# Patient Record
Sex: Female | Born: 1985 | Race: Black or African American | Hispanic: No | Marital: Single | Smoking: Never smoker
Health system: Southern US, Community
[De-identification: ages and names within clinical notes are randomized; demographics above are authoritative.]

## PROBLEM LIST (undated history)

## (undated) DIAGNOSIS — L309 Dermatitis, unspecified: Secondary | ICD-10-CM

## (undated) DIAGNOSIS — J45909 Unspecified asthma, uncomplicated: Secondary | ICD-10-CM

## (undated) HISTORY — DX: Unspecified asthma, uncomplicated: J45.909

## (undated) HISTORY — DX: Dermatitis, unspecified: L30.9

---

## 2017-04-14 ENCOUNTER — Encounter (INDEPENDENT_AMBULATORY_CARE_PROVIDER_SITE_OTHER): Payer: Self-pay | Admitting: Physician Assistant

## 2017-04-14 ENCOUNTER — Ambulatory Visit (INDEPENDENT_AMBULATORY_CARE_PROVIDER_SITE_OTHER): Payer: Medicaid Other | Admitting: Physician Assistant

## 2017-04-14 VITALS — BP 110/71 | HR 75 | Temp 98.4°F | Ht 67.0 in | Wt 254.4 lb

## 2017-04-14 DIAGNOSIS — Z0000 Encounter for general adult medical examination without abnormal findings: Secondary | ICD-10-CM | POA: Diagnosis not present

## 2017-04-14 DIAGNOSIS — Z029 Encounter for administrative examinations, unspecified: Secondary | ICD-10-CM

## 2017-04-14 NOTE — Progress Notes (Signed)
   Subjective:  Patient ID: Mckenzie [name], female    DOB: [birth year]  Age: 31 y.o. MRN: [id number]  CC: physical exam and PPD  HPI Mckenzie JuryYvette Argo is a [age] y.o. 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 Does not bring physical exam form but shows a link of document on her phone. Does not have any immunization records for review either. Does not have any complaints or symptoms.   ROS Review of Systems  Constitutional: Negative for chills, fever and malaise/fatigue.  Eyes: Negative for blurred vision.  Respiratory: Negative for shortness of breath.   Cardiovascular: Negative for chest pain and palpitations.  Gastrointestinal: Negative for abdominal pain and nausea.  Genitourinary: Negative for dysuria and hematuria.  Musculoskeletal: Negative for joint pain and myalgias.  Skin: Negative for rash.  Neurological: Negative for tingling and headaches.  Psychiatric/Behavioral: Negative for depression. The patient is not nervous/anxious.     Objective:  BP 110/71 (BP Location: Left Arm, Patient Position: Sitting, Cuff Size: Large)   Pulse 75   Temp 98.4 F (36.9 C) (Oral)   Ht 5\' 7"  (1.702 m)   Wt 254 lb 6.4 oz (115.4 kg)   SpO2 99%   BMI 39.84 kg/m   BP/Weight 04/14/2017  Systolic BP 110  Diastolic BP 71  Wt. (Lbs) 254.4  BMI 39.84      Physical Exam  Constitutional: She is oriented to person, place, and time.  Well developed, obese, NAD, polite  HENT:  Head: Normocephalic and atraumatic.  Mouth/Throat: No oropharyngeal exudate.  No oral thrush  Eyes: No scleral icterus.  Neck: Normal range of motion. Neck supple. No thyromegaly present.  Cardiovascular: Normal rate, regular rhythm and normal heart sounds.   Pulmonary/Chest: Effort normal and breath sounds normal. No respiratory distress. She has no wheezes. She has no rales.  Abdominal: Soft. Bowel sounds are normal. She exhibits no distension. There is no tenderness.   Musculoskeletal: She exhibits no edema.  LEs, UEs, and back with full aROM  Lymphadenopathy:    She has no cervical adenopathy.  Neurological: She is alert and oriented to person, place, and time. No cranial nerve deficit. Coordination normal.  Gait normal  Skin: Skin is warm and dry. No rash noted. No erythema. No pallor.  Psychiatric: She has a normal mood and affect. Her behavior is normal. Thought content normal.  Vitals reviewed.    Assessment & Plan:   1. Annual physical exam - TSH; Future - CBC with Differential; Future - Comprehensive metabolic panel; Future - Hepatitis B surface antibody; Future - Lipid panel  2. Encounters for administrative purposes - PPD - TSH; Future - CBC with Differential; Future - Comprehensive metabolic panel; Future - Hepatitis B surface antibody; Future - Lipid panel    Follow-up: Return 48-72 hrs for PPD read.   Loletta Specteroger David Gomez PA

## 2017-04-14 NOTE — Patient Instructions (Signed)
Tuberculin Skin Test  Why am I having this test?  Tuberculosis (TB) is a bacterial infection caused by Mycobacterium tuberculosis. Most people who are exposed to these bacteria have a strong enough defense (immune) system to prevent the bacteria from causing TB and developing symptoms. Their bodies prevent the germs from being active and making them sick (latent TB infection).  However, if you have TB germs in your body and your immune system is weak, you can develop a TB infection. This can cause symptoms such as:  · Night sweats.  · Fever.  · Weakness.  · Weight loss.    A latent TB infection can also become active later in life if your immune system becomes weakened or compromised.  You may have this test if your health care provider suspects that you have TB. You may also have this test to screen for TB if you are at risk for getting the disease. Those at increased risk include:  · People who inject illegal drugs or share needles.  · People with HIV or other diseases that affect immunity.  · Health care workers.  · People who live in high-risk communities, such as homeless shelters, nursing homes, and correctional facilities.  · People who have been in contact with someone with TB.  · People from countries where TB is more common.    If you are in a high-risk group, your health care provider may wish to screen for TB more often. This can help prevent the spread of the disease. Sometimes TB screening is required when starting a new job, such as becoming a health care worker or a teacher. Colleges or universities may require it of new students.  What is being tested?  A tuberculin skin test is the main test used to check for exposure to the bacteria that can cause TB. The test checks for antibodies to the bacteria. Antibodies are proteins that your body produces to protect you from germs and other things that can make you sick.  Your health care provider will inject a solution known as PPD (purified  protein derivative) under the first layer of skin on your arm. This causes a blister-like bubble to form at the site. Your health care provider will then examine the site after a number of hours have passed to see if a reaction has occurred.  How do I prepare for this test?  There is no preparation required for this test.  What do the results mean?  Your test results will be reported as either negative or positive.  If the tuberculin skin test produces a negative result, it is likely that you do not have TB and have not been exposed to the TB bacteria.  If you or your health care provider suspects exposure, however, you may want to repeat the test a few weeks later. A blood test may also be used to check for TB. This is because you will not react to the tuberculin skin test until several weeks after exposure to TB bacteria.  If you test positive to the tuberculin skin test, it is likely that you have been exposed to TB bacteria. The test does not distinguish between an active and a latent TB infection.  A false-positive result can occur. A false-positive result for TB bacteria is incorrect because it indicates a condition or finding is present when it is not.  Talk to your health care provider to discuss your results, treatment options, and if necessary, the need for more tests.    It is your responsibility to obtain your test results. Ask the lab or department performing the test when and how you will get your results. Talk with your health care provider if you have any questions about your results.  Talk with your health care provider to discuss your results, treatment options, and if necessary, the need for more tests. Talk with your health care provider if you have any questions about your results.  This information is not intended to replace advice given to you by your health care provider. Make sure you discuss any questions you have with your health care provider.   Document Released: 07/03/2005 Document Revised: 05/26/2016 Document Reviewed: 01/17/2014  Elsevier Interactive Patient Education © 2018 [employer]

## 2017-04-16 ENCOUNTER — Other Ambulatory Visit (INDEPENDENT_AMBULATORY_CARE_PROVIDER_SITE_OTHER): Payer: Medicaid Other

## 2017-04-16 DIAGNOSIS — Z0000 Encounter for general adult medical examination without abnormal findings: Secondary | ICD-10-CM

## 2017-04-16 DIAGNOSIS — Z029 Encounter for administrative examinations, unspecified: Secondary | ICD-10-CM

## 2017-04-16 LAB — TB SKIN TEST
INDURATION: 0 mm
TB SKIN TEST: NEGATIVE

## 2017-04-17 ENCOUNTER — Telehealth (INDEPENDENT_AMBULATORY_CARE_PROVIDER_SITE_OTHER): Payer: Self-pay

## 2017-04-17 LAB — COMPREHENSIVE METABOLIC PANEL
ALK PHOS: 49 IU/L (ref 39–117)
ALT: 12 IU/L (ref 0–32)
AST: 12 IU/L (ref 0–40)
Albumin/Globulin Ratio: 1.4 (ref 1.2–2.2)
Albumin: 4.2 g/dL (ref 3.5–5.5)
BILIRUBIN TOTAL: 0.3 mg/dL (ref 0.0–1.2)
BUN/Creatinine Ratio: 11 (ref 9–23)
BUN: 9 mg/dL (ref 6–20)
CHLORIDE: 103 mmol/L (ref 96–106)
CO2: 26 mmol/L (ref 20–29)
CREATININE: 0.83 mg/dL (ref 0.57–1.00)
Calcium: 9.4 mg/dL (ref 8.7–10.2)
GFR calc Af Amer: 108 mL/min/{1.73_m2} (ref >59)
GFR calc non Af Amer: 94 mL/min/{1.73_m2} (ref >59)
GLUCOSE: 88 mg/dL (ref 65–99)
Globulin, Total: 2.9 g/dL (ref 1.5–4.5)
Potassium: 4.2 mmol/L (ref 3.5–5.2)
SODIUM: 141 mmol/L (ref 134–144)
Total Protein: 7.1 g/dL (ref 6.0–8.5)

## 2017-04-17 LAB — LIPID PANEL
CHOLESTEROL TOTAL: 167 mg/dL (ref 100–199)
Chol/HDL Ratio: 3.6 ratio (ref 0.0–4.4)
HDL: 46 mg/dL (ref >39)
LDL CALC: 107 mg/dL — AB (ref 0–99)
Triglycerides: 71 mg/dL (ref 0–149)
VLDL CHOLESTEROL CAL: 14 mg/dL (ref 5–40)

## 2017-04-17 LAB — CBC WITH DIFFERENTIAL/PLATELET
BASOS ABS: 0 10*3/uL (ref 0.0–0.2)
Basos: 0 %
EOS (ABSOLUTE): 0.2 10*3/uL (ref 0.0–0.4)
Eos: 3 %
Hematocrit: 39.7 % (ref 34.0–46.6)
Hemoglobin: 13.4 g/dL (ref 11.1–15.9)
Immature Grans (Abs): 0 10*3/uL (ref 0.0–0.1)
Immature Granulocytes: 0 %
LYMPHS ABS: 2.7 10*3/uL (ref 0.7–3.1)
Lymphs: 39 %
MCH: 31.8 pg (ref 26.6–33.0)
MCHC: 33.8 g/dL (ref 31.5–35.7)
MCV: 94 fL (ref 79–97)
Monocytes Absolute: 0.3 10*3/uL (ref 0.1–0.9)
Monocytes: 4 %
Neutrophils Absolute: 3.9 10*3/uL (ref 1.4–7.0)
Neutrophils: 54 %
Platelets: 401 10*3/uL — ABNORMAL HIGH (ref 150–379)
RBC: 4.21 x10E6/uL (ref 3.77–5.28)
RDW: 13.8 % (ref 12.3–15.4)
WBC: 7 10*3/uL (ref 3.4–10.8)

## 2017-04-17 LAB — HEPATITIS B SURFACE ANTIBODY, QUANTITATIVE: HEPATITIS B SURF AB QUANT: 29.8 m[IU]/mL

## 2017-04-17 LAB — TSH: TSH: 2.77 u[IU]/mL (ref 0.450–4.500)

## 2017-04-17 NOTE — Telephone Encounter (Signed)
-----   Message from Loletta Specteroger David Gomez, PA-C sent at 04/17/2017 12:08 PM EDT ----- She is showing immunity to Hep B. All other labs are normal also.

## 2017-04-17 NOTE — Telephone Encounter (Signed)
Results provided to patient. Mckenzie [name], CMA  

## 2017-08-16 ENCOUNTER — Encounter (HOSPITAL_COMMUNITY): Payer: Self-pay | Admitting: Emergency Medicine

## 2017-08-16 ENCOUNTER — Emergency Department (HOSPITAL_COMMUNITY): Payer: No Typology Code available for payment source

## 2017-08-16 ENCOUNTER — Emergency Department (HOSPITAL_COMMUNITY)
Admission: EM | Admit: 2017-08-16 | Discharge: 2017-08-16 | Disposition: A | Discharge status: Home / Self Care | Payer: No Typology Code available for payment source | Attending: Emergency Medicine | Admitting: Emergency Medicine

## 2017-08-16 DIAGNOSIS — S20212A Contusion of left front wall of thorax, initial encounter: Secondary | ICD-10-CM | POA: Diagnosis not present

## 2017-08-16 DIAGNOSIS — Y929 Unspecified place or not applicable: Secondary | ICD-10-CM | POA: Insufficient documentation

## 2017-08-16 DIAGNOSIS — Z23 Encounter for immunization: Secondary | ICD-10-CM | POA: Insufficient documentation

## 2017-08-16 DIAGNOSIS — S91012A Laceration without foreign body, left ankle, initial encounter: Secondary | ICD-10-CM

## 2017-08-16 DIAGNOSIS — Y999 Unspecified external cause status: Secondary | ICD-10-CM | POA: Diagnosis not present

## 2017-08-16 DIAGNOSIS — S99912A Unspecified injury of left ankle, initial encounter: Secondary | ICD-10-CM | POA: Diagnosis present

## 2017-08-16 DIAGNOSIS — Y939 Activity, unspecified: Secondary | ICD-10-CM | POA: Diagnosis not present

## 2017-08-16 MED ORDER — IBUPROFEN 800 MG PO TABS: 800.0000 mg | ORAL_TABLET | Freq: Three times a day (TID) | ORAL | 0 refills | Status: DC

## 2017-08-16 MED ORDER — LIDOCAINE HCL (PF) 1 % IJ SOLN
5.0000 mL | Freq: Once | INTRAMUSCULAR | Status: AC
  Administered 2017-08-16: 5 mL via INTRADERMAL
  Filled 2017-08-16: qty 5

## 2017-08-16 MED ORDER — TETANUS-DIPHTH-ACELL PERTUSSIS 5-2.5-18.5 LF-MCG/0.5 IM SUSP
0.5000 mL | Freq: Once | INTRAMUSCULAR | Status: AC
  Administered 2017-08-16: 0.5 mL via INTRAMUSCULAR
  Filled 2017-08-16: qty 0.5

## 2017-08-16 MED ORDER — CYCLOBENZAPRINE HCL 10 MG PO TABS: 10.0000 mg | ORAL_TABLET | Freq: Two times a day (BID) | ORAL | 0 refills | Status: DC | PRN

## 2017-08-16 MED ORDER — IBUPROFEN 800 MG PO TABS
800.0000 mg | ORAL_TABLET | Freq: Once | ORAL | Status: AC
  Administered 2017-08-16: 800 mg via ORAL
  Filled 2017-08-16: qty 1
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
## 2017-08-16 NOTE — ED Notes (Signed)
C Collar removed by Dr. Hyacinth MeekerMiller
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
## 2017-08-16 NOTE — ED Notes (Signed)
ED Provider at bedside. 

## 2017-08-16 NOTE — Discharge Instructions (Signed)
Please take Tylenol or ibuprofen for pain, Flexeril if you have muscle spasm and tightness, please apply warm compresses to the neck and the back to help prevent this.  You should have the stitches removed at your doctor's office within 7 days.  If you develop increased pain redness swelling or pus from the wound or any fevers return to the emergency department immediately.

## 2017-10-17 ENCOUNTER — Telehealth (INDEPENDENT_AMBULATORY_CARE_PROVIDER_SITE_OTHER): Payer: Self-pay | Admitting: Physician Assistant

## 2017-10-17 NOTE — Telephone Encounter (Signed)
Called patient to inform her that Tb results are ready for pick up at the front desk, patient voicemail is full unable to leave message. Maryjean Mornempestt S Rakwon Letourneau, CMA

## 2017-10-17 NOTE — Telephone Encounter (Signed)
Pt wants a copy of her TB Test . Please, call her to pick up at  [phone] Thank You

## 2017-12-09 ENCOUNTER — Encounter (INDEPENDENT_AMBULATORY_CARE_PROVIDER_SITE_OTHER): Payer: Self-pay

## 2017-12-09 ENCOUNTER — Telehealth (INDEPENDENT_AMBULATORY_CARE_PROVIDER_SITE_OTHER): Payer: Self-pay | Admitting: Physician Assistant

## 2017-12-09 NOTE — Telephone Encounter (Signed)
Pt came to the office to signed a release form for a TB test result be sent to her job [employer], fax and letter of the result was sent today

## 2017-12-25 ENCOUNTER — Other Ambulatory Visit: Payer: Self-pay

## 2017-12-25 ENCOUNTER — Ambulatory Visit (INDEPENDENT_AMBULATORY_CARE_PROVIDER_SITE_OTHER): Payer: Medicaid Other | Admitting: Physician Assistant

## 2017-12-25 ENCOUNTER — Encounter (INDEPENDENT_AMBULATORY_CARE_PROVIDER_SITE_OTHER): Payer: Self-pay | Admitting: Physician Assistant

## 2017-12-25 VITALS — BP 136/75 | HR 62 | Temp 98.0°F | Wt 259.8 lb

## 2017-12-25 DIAGNOSIS — L818 Other specified disorders of pigmentation: Secondary | ICD-10-CM | POA: Diagnosis not present

## 2017-12-25 MED ORDER — COAL TAR 20 % EX SOLN: 1.0000 "application " | Freq: Every day | CUTANEOUS | 2 refills | Status: AC

## 2017-12-25 NOTE — Progress Notes (Signed)
   Subjective:  Patient ID: Mckenzie [name], female    DOB: 1986/06/03  Age: 32 y.o. MRN: [id number]  CC: vagina  HPI  Used spray on Nare three weeks ago on her genital region. Says she has hypopigmentation around perineum and anal region. Attributed to the liquification of the nare spray. Says she has begun to itch over the last two days, possibly from hair regrowing. Has not applied anything else for relief. No crusting, scaling, erythema, bleeding, suppuration, or pain.    Outpatient Medications Prior to Visit  Medication Sig Dispense Refill  . cyclobenzaprine (FLEXERIL) 10 MG tablet Take 1 tablet (10 mg total) 2 (two) times daily as needed by mouth for muscle spasms. 20 tablet 0  . ibuprofen (ADVIL,MOTRIN) 800 MG tablet Take 1 tablet (800 mg total) 3 (three) times daily by mouth. 21 tablet 0   No facility-administered medications prior to visit.      ROS Review of Systems  Constitutional: Negative for chills, fever and malaise/fatigue.  Eyes: Negative for blurred vision.  Respiratory: Negative for shortness of breath.   Cardiovascular: Negative for chest pain and palpitations.  Gastrointestinal: Negative for abdominal pain and nausea.  Genitourinary: Negative for dysuria and hematuria.  Musculoskeletal: Negative for joint pain and myalgias.  Skin: Negative for rash.       hypopigmentation  Neurological: Negative for tingling and headaches.  Psychiatric/Behavioral: Negative for depression. The patient is not nervous/anxious.     Objective:  BP 136/75 (BP Location: Left Arm, Patient Position: Sitting, Cuff Size: Large)   Pulse 62   Temp 98 F (36.7 C) (Oral)   Wt 259 lb 12.8 oz (117.8 kg)   SpO2 98%   BMI 40.69 kg/m   BP/Weight 12/25/2017 08/16/2017 04/14/2017  Systolic BP 136 121 110  Diastolic BP 75 71 71  Wt. (Lbs) 259.8 - 254.4  BMI 40.69 - 39.84      Physical Exam  Constitutional: She is oriented to person, place, and time.  Well developed, obese, NAD, polite   HENT:  Head: Normocephalic and atraumatic.  Cardiovascular: Normal rate, regular rhythm and normal heart sounds.  Pulmonary/Chest: Effort normal and breath sounds normal.  Musculoskeletal: She exhibits no edema.  Neurological: She is alert and oriented to person, place, and time.  Skin: Skin is warm and dry.  Moderate hypopigmentation with mildly erythematous borders in the perineum and lateral to the right side of clitoris. No scaling, crusting, suppuration, bleeding, cellulitis, or induration.   Psychiatric: She has a normal mood and affect. Her behavior is normal. Thought content normal.  Vitals reviewed.    Assessment & Plan:    1. Postinflammatory hypopigmentation - Resulting from chemical delapitory use - Coal Tar 20 % SOLN; Apply 1 application topically daily for 7 days.  Dispense: 100 mL; Refill: 2. Using in an attempt to stimulate melanocytes.   Meds ordered this encounter  Medications  . Coal Tar 20 % SOLN    Sig: Apply 1 application topically daily for 7 days.    Dispense:  100 mL    Refill:  2    Order Specific Question:   Supervising Provider    Answer:   Quentin AngstJEGEDE, OLUGBEMIGA E [id number]    Follow-up: Return if symptoms worsen or fail to improve.   Loletta Specteroger David Ismaeel Arvelo PA

## 2018-04-27 ENCOUNTER — Ambulatory Visit (INDEPENDENT_AMBULATORY_CARE_PROVIDER_SITE_OTHER): Payer: Medicaid Other | Admitting: Physician Assistant

## 2018-04-27 ENCOUNTER — Other Ambulatory Visit: Payer: Self-pay

## 2018-04-27 ENCOUNTER — Other Ambulatory Visit (HOSPITAL_COMMUNITY)
Admission: RE | Admit: 2018-04-27 | Discharge: 2018-04-27 | Disposition: A | Discharge status: Home / Self Care | Payer: Medicaid Other | Source: Ambulatory Visit | Attending: Physician Assistant | Admitting: Physician Assistant

## 2018-04-27 ENCOUNTER — Encounter (INDEPENDENT_AMBULATORY_CARE_PROVIDER_SITE_OTHER): Payer: Self-pay | Admitting: Physician Assistant

## 2018-04-27 VITALS — BP 112/75 | HR 70 | Temp 98.0°F | Ht 67.0 in | Wt 252.2 lb

## 2018-04-27 DIAGNOSIS — J309 Allergic rhinitis, unspecified: Secondary | ICD-10-CM

## 2018-04-27 DIAGNOSIS — Z124 Encounter for screening for malignant neoplasm of cervix: Secondary | ICD-10-CM | POA: Diagnosis not present

## 2018-04-27 DIAGNOSIS — T8332XA Displacement of intrauterine contraceptive device, initial encounter: Secondary | ICD-10-CM

## 2018-04-27 DIAGNOSIS — Z114 Encounter for screening for human immunodeficiency virus [HIV]: Secondary | ICD-10-CM

## 2018-04-27 LAB — POCT URINE PREGNANCY: Preg Test, Ur: NEGATIVE

## 2018-04-27 MED ORDER — HYDROXYZINE HCL 10 MG PO TABS: 10.0000 mg | ORAL_TABLET | Freq: Every day | ORAL | 0 refills | Status: DC

## 2018-04-27 MED ORDER — OXYMETAZOLINE HCL 0.05 % NA SOLN: 1.0000 | Freq: Two times a day (BID) | NASAL | 0 refills | Status: DC

## 2018-04-27 MED ORDER — FLUTICASONE PROPIONATE 50 MCG/ACT NA SUSP: 2.0000 | Freq: Every day | NASAL | 3 refills | Status: DC

## 2018-04-27 MED ORDER — CETIRIZINE HCL 10 MG PO TABS: 10.0000 mg | ORAL_TABLET | Freq: Every day | ORAL | 3 refills | Status: DC

## 2018-04-27 NOTE — Progress Notes (Signed)
Subjective:  Patient ID: Mckenzie [name], female    DOB: Dec 14, 1985  Age: 32 y.o. MRN: [id number]  CC: Chest congestion  HPI Mckenzie [name] is a 32 y.o. female with a medical history of PNA presents with concern for IUD expulsion. Says she nor her boyfriend can feel the strings of the IUD. Does not endorse pelvic pain, vaginal bleeding, or dyspareunia.     Pt also complains of chronic nasal congestion. Has tried OTC products with little to no relief. Has not used any nasal sprays. Endorses cough with deep inhalation or when laughing. Does not endorse f/c/n/v, rash, CP, palpitations, SOB, HA, abdominal pain, or GI/GU sxs.    ROS Review of Systems  Constitutional: Negative for chills, fever and malaise/fatigue.  Eyes: Negative for blurred vision.  Respiratory: Negative for shortness of breath.   Cardiovascular: Negative for chest pain and palpitations.  Gastrointestinal: Negative for abdominal pain and nausea.  Genitourinary: Negative for dysuria and hematuria.  Musculoskeletal: Negative for joint pain and myalgias.  Skin: Negative for rash.  Neurological: Negative for tingling and headaches.  Psychiatric/Behavioral: Negative for depression. The patient is not nervous/anxious.     Objective:  Ht 5\' 7"  (1.702 m)   Wt 252 lb 3.2 oz (114.4 kg)   BMI 39.50 kg/m   BP/Weight 04/27/2018 12/25/2017 08/16/2017  Systolic BP - 136 121  Diastolic BP - 75 71  Wt. (Lbs) 252.2 259.8 -  BMI 39.5 40.69 -      Physical Exam  Constitutional: She is oriented to person, place, and time.  Well developed, obese, NAD, polite  HENT:  Head: Normocephalic and atraumatic.  Turbinates severely hypertrophic and pale.Postnasal drip. Tonsils 1+ bilaterally.   Eyes: No scleral icterus.  Neck: Normal range of motion. Neck supple. No thyromegaly present.  Cardiovascular: Normal rate, regular rhythm and normal heart sounds.  Pulmonary/Chest: Effort normal and breath sounds normal.  Abdominal: Soft. Bowel  sounds are normal. There is no tenderness.  Genitourinary:  Genitourinary Comments: No vaginal discharge. Cervical os without IUD strings visualized or felt. No adnexal mass or tenderness bilaterally. No uterine mass or tenderness.   Musculoskeletal: She exhibits no edema.  Lymphadenopathy:    She has no cervical adenopathy.  Neurological: She is alert and oriented to person, place, and time.  Skin: Skin is warm and dry. No rash noted. No erythema. No pallor.  Psychiatric: She has a normal mood and affect. Her behavior is normal. Thought content normal.  Vitals reviewed.    Assessment & Plan:   1. Allergic rhinitis, unspecified seasonality, unspecified trigger - oxymetazoline (AFRIN NASAL SPRAY) 0.05 % nasal spray; Place 1 spray into both nostrils 2 (two) times daily. DO NOT USE FOR MORE THAN THREE CONSECUTIVE DAYS.  Dispense: 30 mL; Refill: 0 - fluticasone (FLONASE) 50 MCG/ACT nasal spray; Place 2 sprays into both nostrils daily.  Dispense: 16 g; Refill: 3  2. Screening for HIV (human immunodeficiency virus) - HIV antibody  3. Screening for cervical cancer - Cytology - PAP([place])  4. Intrauterine contraceptive device threads lost, initial encounter - US Pelvis Complete; Future - POCT urine pregnancy   Meds ordered this encounter  Medications  . oxymetazoline (AFRIN NASAL SPRAY) 0.05 % nasal spray    Sig: Place 1 spray into both nostrils 2 (two) times daily. DO NOT USE FOR MORE THAN THREE CONSECUTIVE DAYS.    Dispense:  30 mL    Refill:  0    Order Specific Question:   Supervising Provider  Answer:   Hoy Register [[id number]]  . fluticasone (FLONASE) 50 MCG/ACT nasal spray    Sig: Place 2 sprays into both nostrils daily.    Dispense:  16 g    Refill:  3    Order Specific Question:   Supervising Provider    Answer:   Hoy Register [[id number]]  . cetirizine (ZYRTEC) 10 MG tablet    Sig: Take 1 tablet (10 mg total) by mouth daily.    Dispense:  30 tablet    Refill:  3     Order Specific Question:   Supervising Provider    Answer:   Hoy Register [[id number]]  . hydrOXYzine (ATARAX/VISTARIL) 10 MG tablet    Sig: Take 1 tablet (10 mg total) by mouth at bedtime.    Dispense:  30 tablet    Refill:  0    Order Specific Question:   Supervising Provider    Answer:   Hoy Register [[id number]]    Follow-up: Return if symptoms worsen or fail to improve.   Loletta Specter PA

## 2018-04-27 NOTE — Patient Instructions (Signed)

## 2018-04-28 ENCOUNTER — Other Ambulatory Visit (INDEPENDENT_AMBULATORY_CARE_PROVIDER_SITE_OTHER): Payer: Self-pay | Admitting: Physician Assistant

## 2018-04-28 ENCOUNTER — Telehealth (INDEPENDENT_AMBULATORY_CARE_PROVIDER_SITE_OTHER): Payer: Self-pay

## 2018-04-28 DIAGNOSIS — B9689 Other specified bacterial agents as the cause of diseases classified elsewhere: Secondary | ICD-10-CM

## 2018-04-28 DIAGNOSIS — N760 Acute vaginitis: Principal | ICD-10-CM

## 2018-04-28 LAB — CYTOLOGY - PAP
Bacterial vaginitis: POSITIVE — AB
Candida vaginitis: NEGATIVE
Chlamydia: NEGATIVE
Diagnosis: NEGATIVE
NEISSERIA GONORRHEA: NEGATIVE
TRICH (WINDOWPATH): NEGATIVE

## 2018-04-28 LAB — HIV ANTIBODY (ROUTINE TESTING W REFLEX): HIV Screen 4th Generation wRfx: NONREACTIVE

## 2018-04-28 MED ORDER — METRONIDAZOLE 500 MG PO TABS: 500.0000 mg | ORAL_TABLET | Freq: Two times a day (BID) | ORAL | 0 refills | Status: AC

## 2018-04-28 NOTE — Telephone Encounter (Signed)
-----   Message from Loletta Specteroger David Gomez, PA-C sent at 04/28/2018  8:38 AM EDT ----- HIV negative.

## 2018-04-28 NOTE — Telephone Encounter (Signed)
-----   Message from Loletta Specteroger David Gomez, PA-C sent at 04/28/2018  5:09 PM EDT ----- PAP negative for cancer. Positive for BV. I have sent metronidazole to [facility] [address].

## 2018-04-28 NOTE — Telephone Encounter (Signed)
Patient aware that pap is negative for cancer but positive for BV, [facility] [address]. Maryjean Mornempestt S Roberts, CMA

## 2018-04-28 NOTE — Telephone Encounter (Signed)
Patient aware of negative HIV. Tempestt S Roberts, CMA  

## 2018-04-30 ENCOUNTER — Ambulatory Visit (HOSPITAL_COMMUNITY)
Admission: RE | Admit: 2018-04-30 | Discharge: 2018-04-30 | Disposition: A | Discharge status: Home / Self Care | Payer: Medicaid Other | Source: Ambulatory Visit | Attending: Physician Assistant | Admitting: Physician Assistant

## 2018-04-30 DIAGNOSIS — T8332XA Displacement of intrauterine contraceptive device, initial encounter: Secondary | ICD-10-CM | POA: Diagnosis not present

## 2018-04-30 NOTE — Addendum Note (Signed)
Addended by: Maryjean MornOBERTS, TEMPESTT S on: 04/30/2018 01:36 PM   Modules accepted: Orders

## 2018-05-01 ENCOUNTER — Telehealth (INDEPENDENT_AMBULATORY_CARE_PROVIDER_SITE_OTHER): Payer: Self-pay

## 2018-05-01 NOTE — Telephone Encounter (Signed)
Patient is aware that IUD is present and in the proper position. Maryjean Mornempestt S Roberts, CMA

## 2018-05-01 NOTE — Telephone Encounter (Signed)
-----   Message from Loletta Specteroger David Gomez, PA-C sent at 04/30/2018  5:16 PM EDT ----- IUD is present and in the proper position.

## 2018-05-09 ENCOUNTER — Encounter (HOSPITAL_COMMUNITY): Payer: Self-pay | Admitting: Emergency Medicine

## 2018-05-09 ENCOUNTER — Emergency Department (HOSPITAL_COMMUNITY): Payer: Medicaid Other

## 2018-05-09 ENCOUNTER — Other Ambulatory Visit: Payer: Self-pay

## 2018-05-09 ENCOUNTER — Emergency Department (HOSPITAL_COMMUNITY)
Admission: EM | Admit: 2018-05-09 | Discharge: 2018-05-09 | Disposition: A | Discharge status: Home / Self Care | Payer: Medicaid Other | Attending: Emergency Medicine | Admitting: Emergency Medicine

## 2018-05-09 DIAGNOSIS — J40 Bronchitis, not specified as acute or chronic: Secondary | ICD-10-CM | POA: Diagnosis not present

## 2018-05-09 DIAGNOSIS — R0602 Shortness of breath: Secondary | ICD-10-CM | POA: Diagnosis not present

## 2018-05-09 DIAGNOSIS — R05 Cough: Secondary | ICD-10-CM | POA: Diagnosis not present

## 2018-05-09 DIAGNOSIS — Z79899 Other long term (current) drug therapy: Secondary | ICD-10-CM | POA: Diagnosis not present

## 2018-05-09 DIAGNOSIS — J209 Acute bronchitis, unspecified: Secondary | ICD-10-CM | POA: Insufficient documentation

## 2018-05-09 MED ORDER — IPRATROPIUM-ALBUTEROL 0.5-2.5 (3) MG/3ML IN SOLN
3.0000 mL | Freq: Once | RESPIRATORY_TRACT | Status: AC
  Administered 2018-05-09: 3 mL via RESPIRATORY_TRACT
  Filled 2018-05-09: qty 3

## 2018-05-09 MED ORDER — BENZONATATE 100 MG PO CAPS: 100.0000 mg | ORAL_CAPSULE | Freq: Three times a day (TID) | ORAL | 0 refills | Status: DC

## 2018-05-09 MED ORDER — AZITHROMYCIN 250 MG PO TABS: ORAL_TABLET | ORAL | 0 refills | Status: DC

## 2018-05-09 MED ORDER — ALBUTEROL SULFATE HFA 108 (90 BASE) MCG/ACT IN AERS
2.0000 | INHALATION_SPRAY | RESPIRATORY_TRACT | Status: DC | PRN
  Administered 2018-05-09: 2 via RESPIRATORY_TRACT
  Filled 2018-05-09: qty 6.7

## 2018-05-09 MED ORDER — PREDNISONE 50 MG PO TABS: ORAL_TABLET | ORAL | 0 refills | Status: DC

## 2018-05-09 NOTE — ED Triage Notes (Signed)
Pt to ER for evaluation of productive cough x3 weeks. Poor historian. Unable to give color of sputum.

## 2018-05-09 NOTE — ED Provider Notes (Signed)
[facility] EMERGENCY [facility] Provider Note   CSN: [id number] Arrival date & time: 05/09/18  [id number]     History   Chief Complaint Chief Complaint  Patient presents with  . Cough    HPI Mckenzie [name] is a 32 y.o. female who presents to the ED with cough and congestion that started 3 weeks ago and has gotten worse. The cough is productive. The sputum if yellow/brown. Patient went to her PCP last week and was treated with Flonase and Cetrizine and hydroxyzine. Patient reports her symptoms have gotten worse.    The history is provided by the patient. No language interpreter was used.  Cough  This is a new problem. The current episode started more than 1 week ago. The problem has been gradually worsening. The cough is productive of brown sputum. There has been no fever. Associated symptoms include chills, rhinorrhea, shortness of breath and wheezing. Pertinent negatives include no sweats, no ear congestion, no ear pain, no headaches, no sore throat, no myalgias and no eye redness.    History reviewed. No pertinent past medical history.  There are no active problems to display for this patient.   History reviewed. No pertinent surgical history.   OB History   None      Home Medications    Prior to Admission medications   Medication Sig Start Date End Date Taking? Authorizing Provider  azithromycin (ZITHROMAX Z-PAK) 250 MG tablet Take 2 tablets today and then one tablet PO daily 05/09/18   Kerrie BuffaloNeese, Pria Klosinski M, NP  benzonatate (TESSALON) 100 MG capsule Take 1 capsule (100 mg total) by mouth every 8 (eight) hours. 05/09/18   Janne NapoleonNeese, Salman Wellen M, NP  cetirizine (ZYRTEC) 10 MG tablet Take 1 tablet (10 mg total) by mouth daily. 04/27/18   Loletta SpecterGomez, Roger David, PA-C  fluticasone [facility](FLONASE) 50 MCG/ACT nasal spray Place 2 sprays into both nostrils daily. 04/27/18   Loletta SpecterGomez, Roger David, PA-C  hydrOXYzine (ATARAX/VISTARIL) 10 MG tablet Take 1 tablet (10 mg total) by mouth at bedtime.  04/27/18   Loletta SpecterGomez, Roger David, PA-C  oxymetazoline (AFRIN NASAL SPRAY) 0.05 % nasal spray Place 1 spray into both nostrils 2 (two) times daily. DO NOT USE FOR MORE THAN THREE CONSECUTIVE DAYS. 04/27/18   Loletta SpecterGomez, Roger David, PA-C  predniSONE (DELTASONE) 50 MG tablet Take one tablet PO daily 05/09/18   Janne NapoleonNeese, Loxley Schmale M, NP    Family History No family history on file.  Social History Social History   Tobacco Use  . Smoking status: Never Smoker  . Smokeless tobacco: Never Used  Substance Use Topics  . Alcohol use: Not on file  . Drug use: Not on file     Allergies   Patient has no known allergies.   Review of Systems Review of Systems  Constitutional: Positive for chills. Negative for fever.  HENT: Positive for rhinorrhea. Negative for ear pain, sinus pressure, sinus pain and sore throat.   Eyes: Negative for redness and itching.  Respiratory: Positive for cough, shortness of breath and wheezing.   Gastrointestinal: Negative for abdominal pain and nausea. Vomiting: only with cough.  Genitourinary: Negative for dysuria.  Musculoskeletal: Negative for back pain and myalgias.  Skin: Negative for rash.  Neurological: Negative for syncope and headaches.  Psychiatric/Behavioral: Negative for confusion.     Physical Exam Updated Vital Signs BP 115/81 (BP Location: Right Arm)   Pulse 77   Temp 98.9 F (37.2 C) (Oral)   Resp 19   SpO2 99%  Physical Exam  Constitutional: She appears well-developed and well-nourished. No distress.  HENT:  Head: Normocephalic.  Right Ear: Tympanic membrane normal.  Left Ear: Tympanic membrane normal.  Nose: Mucosal edema and rhinorrhea present.  Mouth/Throat: Oropharynx is clear and moist.  Eyes: Conjunctivae and EOM are normal.  Neck: Neck supple.  Cardiovascular: Normal rate and regular rhythm.  Pulmonary/Chest: Effort normal. No respiratory distress. She has decreased breath sounds. She has rhonchi.  Musculoskeletal: Normal range of motion.    Neurological: She is alert.  Skin: Skin is warm and dry.  Psychiatric: She has a normal mood and affect. Her behavior is normal.  Nursing note and vitals reviewed.    ED Treatments / Results  Labs (all labs ordered are listed, but only abnormal results are displayed) Labs Reviewed - No data to display  Radiology Dg Chest 2 View  Result Date: 05/09/2018 CLINICAL DATA:  Productive cough for 3 weeks. EXAM: CHEST - 2 VIEW COMPARISON:  Chest x-ray dated 08/16/2017. FINDINGS: The heart size and mediastinal contours are within normal limits. Both lungs are clear. The visualized skeletal structures are unremarkable. IMPRESSION: No active cardiopulmonary disease. No evidence of pneumonia or pulmonary edema. Electronically Signed   By: Bary Richard M.D.   On: 05/09/2018 10:40    Procedures Procedures (including critical care time)  Medications Ordered in ED Medications  albuterol (PROVENTIL HFA;VENTOLIN HFA) 108 (90 Base) MCG/ACT inhaler 2 puff (has no administration in time range)  ipratropium-albuterol (DUONEB) 0.5-2.5 (3) MG/3ML nebulizer solution 3 mL (3 mLs Nebulization Given 05/09/18 1048)   11:15 am patient re examined after neb treatment and breath sounds have improved but wheezing is now heard. Patient reports feeling better.   Initial Impression / Assessment and Plan / ED Course  I have reviewed the triage vital signs and the nursing notes. 32 y.o. female here with cough and congestion that has not improved despite treatment from her PCP. Patient stable for d/c without respiratory distress, normal CXR and O2 SAT 99% on R/A. Discussed with the patient clinical and x-ray findings and plan of care patient agrees with plan.  Final Clinical Impressions(s) / ED Diagnoses   Final diagnoses:  Bronchitis    ED Discharge Orders        Ordered    predniSONE (DELTASONE) 50 MG tablet     05/09/18 1126    azithromycin (ZITHROMAX Z-PAK) 250 MG tablet     05/09/18 1126    benzonatate  (TESSALON) 100 MG capsule  Every 8 hours     05/09/18 1126       Damian Leavell [place], NP 05/09/18 1131    Raeford Razor, MD 05/10/18 1738

## 2018-05-09 NOTE — Discharge Instructions (Signed)
Be sure you are drinking plenty of fluids to stay hydrated and thin the mucous. Follow up with your doctor. Return here as needed.

## 2018-07-22 ENCOUNTER — Ambulatory Visit (INDEPENDENT_AMBULATORY_CARE_PROVIDER_SITE_OTHER): Payer: Medicaid Other | Admitting: Physician Assistant

## 2018-07-22 ENCOUNTER — Encounter (INDEPENDENT_AMBULATORY_CARE_PROVIDER_SITE_OTHER): Payer: Self-pay | Admitting: Physician Assistant

## 2018-07-22 ENCOUNTER — Other Ambulatory Visit: Payer: Self-pay

## 2018-07-22 VITALS — BP 118/77 | HR 74 | Temp 98.1°F | Ht 67.0 in | Wt 251.6 lb

## 2018-07-22 DIAGNOSIS — Z862 Personal history of diseases of the blood and blood-forming organs and certain disorders involving the immune mechanism: Secondary | ICD-10-CM | POA: Diagnosis not present

## 2018-07-22 DIAGNOSIS — L309 Dermatitis, unspecified: Secondary | ICD-10-CM | POA: Diagnosis not present

## 2018-07-22 DIAGNOSIS — J309 Allergic rhinitis, unspecified: Secondary | ICD-10-CM

## 2018-07-22 DIAGNOSIS — H1013 Acute atopic conjunctivitis, bilateral: Secondary | ICD-10-CM

## 2018-07-22 MED ORDER — ALBUTEROL SULFATE HFA 108 (90 BASE) MCG/ACT IN AERS: 2.0000 | INHALATION_SPRAY | Freq: Four times a day (QID) | RESPIRATORY_TRACT | 0 refills | Status: DC | PRN

## 2018-07-22 MED ORDER — KETOTIFEN FUMARATE 0.025 % OP SOLN: 1.0000 [drp] | Freq: Two times a day (BID) | OPHTHALMIC | 0 refills | Status: DC

## 2018-07-22 NOTE — Progress Notes (Signed)
Subjective:  Patient ID: Mckenzie [name], female    DOB: June 10, 1986  Age: 32 y.o. MRN: [id number]  CC: Wheezing and allergies  HPI Mckenzie [name] is a 32 y.o. female with a medical history of    Pt having symptoms of wheezing, nasal congestion, sneezing, bilateral eye watering, and bilateral eye itching. Had taken hydroxyzine and cetirizine for the same symptoms with relief of symptoms. Would like to know if she can restart treatment. Denies any other symptoms besides eczema flares. Occasional eczema flares in the wrists/arms. Uses hydrocortisone with resolution of small papules. Has no current flare but seeks advise to help treat eczema for future flares.       Outpatient Medications Prior to Visit  Medication Sig Dispense Refill  . cetirizine (ZYRTEC) 10 MG tablet Take 1 tablet (10 mg total) by mouth daily. (Patient not taking: Reported on 07/22/2018) 30 tablet 3  . fluticasone (FLONASE) 50 MCG/ACT nasal spray Place 2 sprays into both nostrils daily. (Patient not taking: Reported on 07/22/2018) 16 g 3  . hydrOXYzine (ATARAX/VISTARIL) 10 MG tablet Take 1 tablet (10 mg total) by mouth at bedtime. (Patient not taking: Reported on 07/22/2018) 30 tablet 0  . oxymetazoline (AFRIN NASAL SPRAY) 0.05 % nasal spray Place 1 spray into both nostrils 2 (two) times daily. DO NOT USE FOR MORE THAN THREE CONSECUTIVE DAYS. (Patient not taking: Reported on 07/22/2018) 30 mL 0  . azithromycin (ZITHROMAX Z-PAK) 250 MG tablet Take 2 tablets today and then one tablet PO daily 6 each 0  . benzonatate (TESSALON) 100 MG capsule Take 1 capsule (100 mg total) by mouth every 8 (eight) hours. 21 capsule 0  . predniSONE (DELTASONE) 50 MG tablet Take one tablet PO daily 5 tablet 0   No facility-administered medications prior to visit.      ROS Review of Systems  Constitutional: Positive for malaise/fatigue. Negative for chills and fever.  Eyes: Positive for redness. Negative for blurred vision.       Watery  and itchy eyes  Respiratory: Negative for shortness of breath.        Sneezing  Cardiovascular: Negative for chest pain and palpitations.  Gastrointestinal: Negative for abdominal pain and nausea.  Genitourinary: Negative for dysuria and hematuria.  Musculoskeletal: Negative for joint pain and myalgias.  Skin: Positive for rash.  Neurological: Negative for tingling and headaches.  Psychiatric/Behavioral: Negative for depression. The patient is not nervous/anxious.     Objective:  BP 118/77 (BP Location: Left Arm, Patient Position: Sitting, Cuff Size: Large)   Pulse 74   Temp 98.1 F (36.7 C) (Oral)   Ht 5\' 7"  (1.702 m)   Wt 251 lb 9.6 oz (114.1 kg)   SpO2 99%   BMI 39.41 kg/m   BP/Weight 07/22/2018 05/09/2018 04/27/2018  Systolic BP 118 122 112  Diastolic BP 77 74 75  Wt. (Lbs) 251.6 - 252.2  BMI 39.41 - 39.5      Physical Exam  Constitutional: She is oriented to person, place, and time.  Well developed, well nourished, NAD, polite  HENT:  Head: Normocephalic and atraumatic.  Eyes: Conjunctivae and EOM are normal. Right eye exhibits no discharge. Left eye exhibits no discharge. No scleral icterus.  Rubbing/scratching eyelids frequently  Neck: Normal range of motion. Neck supple. No thyromegaly present.  Cardiovascular: Normal rate, regular rhythm and normal heart sounds.  Pulmonary/Chest: Effort normal and breath sounds normal.  Musculoskeletal: She exhibits no edema.  Neurological: She is alert and oriented to person, place, and time.  Skin: Skin is warm and dry. No rash noted. No erythema. No pallor.  Psychiatric: She has a normal mood and affect. Her behavior is normal. Thought content normal.  Vitals reviewed.    Assessment & Plan:    1. Allergic conjunctivitis and rhinitis, bilateral - Allergens, Zone 2 - ketotifen (ALAWAY) 0.025 % ophthalmic solution; Place 1 drop into both eyes 2 (two) times daily.  Dispense: 5 mL; Refill: 0 - Ambulatory referral to  Allergy  2. Eczema, unspecified type - I have advised pt to use OTC hydrocortisone and moisturizer  Meds ordered this encounter  Medications  . ketotifen (ALAWAY) 0.025 % ophthalmic solution    Sig: Place 1 drop into both eyes 2 (two) times daily.    Dispense:  5 mL    Refill:  0    Order Specific Question:   Supervising Provider    Answer:   Hoy Register [[id number]]  . albuterol (PROVENTIL HFA;VENTOLIN HFA) 108 (90 Base) MCG/ACT inhaler    Sig: Inhale 2 puffs into the lungs every 6 (six) hours as needed for wheezing or shortness of breath.    Dispense:  1 Inhaler    Refill:  0    Order Specific Question:   Supervising Provider    Answer:   Hoy Register [[id number]]    Follow-up: Return if symptoms worsen or fail to improve.   Loletta Specter PA

## 2018-07-22 NOTE — Patient Instructions (Addendum)
Use a moisturizer cream with ceramides for your eczema.   Allergic Conjunctivitis A clear membrane (conjunctiva) covers the white part of your eye and the inner surface of your eyelid. Allergic conjunctivitis happens when this membrane has inflammation. This is caused by allergies. Common causes of allergic reactions (allergens)include:  Outdoor allergens, such as: ? Pollen. ? Grass and weeds. ? Mold spores.  Indoor allergens, such as: ? Dust. ? Smoke. ? Mold. ? Pet dander. ? Animal hair.  This condition can make your eye red or pink. It can also make your eye feel itchy. This condition cannot be spread from one person to another person (is not contagious). Follow these instructions at home:  Try not to be around things that you are allergic to.  Take or apply over-the-counter and prescription medicines only as told by your doctor. These include any eye drops.  Place a cool, clean washcloth on your eye for 10-20 minutes. Do this 3-4 times a day.  Do not touch or rub your eyes.  Do not wear contact lenses until the inflammation is gone. Wear glasses instead.  Do not wear eye makeup until the inflammation is gone.  Keep all follow-up visits as told by your doctor. This is important. Contact a doctor if:  Your symptoms get worse.  Your symptoms do not get better with treatment.  You have mild eye pain.  You are sensitive to light,  You have spots or blisters on your eyes.  You have pus coming from your eye.  You have a fever. Get help right away if:  You have redness, swelling, or other symptoms in only one eye.  Your vision is blurry.  You have vision changes.  You have very bad eye pain. Summary  Allergic conjunctivitis is caused by allergies. It can make your eye red or pink, and it can make your eye feel itchy.  This condition cannot be spread from one person to another person (is not contagious).  Try not to be around things that you are allergic  to.  Take or apply over-the-counter and prescription medicines only as told by your doctor. These include any eye drops.  Contact your doctor if your symptoms get worse or they do not get better with treatment. This information is not intended to replace advice given to you by your health care provider. Make sure you discuss any questions you have with your health care provider. Document Released: 03/13/2010 Document Revised: 05/17/2016 Document Reviewed: 05/17/2016 Elsevier Interactive Patient Education  2017 [employer].

## 2018-07-23 ENCOUNTER — Telehealth (INDEPENDENT_AMBULATORY_CARE_PROVIDER_SITE_OTHER): Payer: Self-pay

## 2018-07-23 LAB — CBC WITH DIFFERENTIAL/PLATELET
BASOS: 0 %
Basophils Absolute: 0 10*3/uL (ref 0.0–0.2)
EOS (ABSOLUTE): 0.2 10*3/uL (ref 0.0–0.4)
EOS: 2 %
HEMOGLOBIN: 13.5 g/dL (ref 11.1–15.9)
Hematocrit: 40 % (ref 34.0–46.6)
IMMATURE GRANS (ABS): 0 10*3/uL (ref 0.0–0.1)
IMMATURE GRANULOCYTES: 0 %
LYMPHS: 34 %
Lymphocytes Absolute: 4.1 10*3/uL — ABNORMAL HIGH (ref 0.7–3.1)
MCH: 32 pg (ref 26.6–33.0)
MCHC: 33.8 g/dL (ref 31.5–35.7)
MCV: 95 fL (ref 79–97)
MONOCYTES: 4 %
Monocytes Absolute: 0.4 10*3/uL (ref 0.1–0.9)
NEUTROS PCT: 60 %
Neutrophils Absolute: 7.1 10*3/uL — ABNORMAL HIGH (ref 1.4–7.0)
PLATELETS: 363 10*3/uL (ref 150–450)
RBC: 4.22 x10E6/uL (ref 3.77–5.28)
RDW: 12.9 % (ref 12.3–15.4)
WBC: 11.9 10*3/uL — ABNORMAL HIGH (ref 3.4–10.8)

## 2018-07-23 NOTE — Telephone Encounter (Signed)
Patient is aware that white blood cells are mildly elevated possibly due to allergies, virus or bacteria. Advised to watch for signs of fever or chills that may develop and to see a provider if signs of infection occur. Patient wants to know if she needs to take iron pills and if so how many. She has a [employer] brand of iron pills. Please advise. Maryjean Morn, CMA

## 2018-07-23 NOTE — Telephone Encounter (Signed)
-----   Message from Loletta Specter, PA-C sent at 07/23/2018  1:05 PM EDT ----- White blood cells mildly elevated possibly due to allergies, virus, or bacteria. Be aware of any fever or chills that may develop. See a provider is signs of infection occur.

## 2018-07-24 NOTE — Telephone Encounter (Signed)
No need for iron pills as she is not anemic.

## 2018-07-24 NOTE — Telephone Encounter (Signed)
Patient is aware that she does not need to take iron pills as she is not anemic. Mckenzie [name], CMA

## 2018-07-26 LAB — ALLERGENS, ZONE 2
Alternaria Alternata IgE: 0.49 kU/L — AB
Amer Sycamore IgE Qn: <0.1 kU/L
Bahia Grass IgE: <0.1 kU/L
Bermuda Grass IgE: <0.1 kU/L
Cedar, Mountain IgE: 0.15 kU/L — AB
Cladosporium Herbarum IgE: 1.01 kU/L — AB
D001-IGE D PTERONYSSINUS: 0.15 kU/L — AB
D002-IGE D FARINAE: 1.24 kU/L — AB
Elm, American IgE: <0.1 kU/L
Johnson Grass IgE: <0.1 kU/L
M001-IGE PENICILLIUM CHRYSOGEN: 3.45 kU/L — AB
M003-IGE ASPERGILLUS FUMIGATUS: 8.75 kU/L — AB
Mucor Racemosus IgE: 0.37 kU/L — AB
Pigweed, Rough IgE: <0.1 kU/L
Ragweed, Short IgE: <0.1 kU/L
Sheep Sorrel IgE Qn: <0.1 kU/L
Stemphylium Herbarum IgE: 0.76 kU/L — AB
Sweet gum IgE RAST Ql: <0.1 kU/L
Timothy Grass IgE: <0.1 kU/L

## 2018-07-27 ENCOUNTER — Telehealth (INDEPENDENT_AMBULATORY_CARE_PROVIDER_SITE_OTHER): Payer: Self-pay

## 2018-07-27 NOTE — Telephone Encounter (Signed)
Patient is aware of several allergies, worse being fungal spore. Referral placed to allergist. Someone from allergy office will call and schedule patient. Maryjean Morn, CMA

## 2018-07-27 NOTE — Telephone Encounter (Signed)
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 I have already referred patient to allergist.

## 2018-09-16 ENCOUNTER — Encounter: Payer: Self-pay | Admitting: Allergy

## 2018-09-16 ENCOUNTER — Ambulatory Visit (INDEPENDENT_AMBULATORY_CARE_PROVIDER_SITE_OTHER): Payer: Medicaid Other | Admitting: Allergy

## 2018-09-16 VITALS — BP 102/70 | HR 62 | Resp 16 | Ht 66.0 in | Wt 254.4 lb

## 2018-09-16 DIAGNOSIS — R062 Wheezing: Secondary | ICD-10-CM | POA: Diagnosis not present

## 2018-09-16 DIAGNOSIS — L2089 Other atopic dermatitis: Secondary | ICD-10-CM | POA: Diagnosis not present

## 2018-09-16 DIAGNOSIS — J3089 Other allergic rhinitis: Secondary | ICD-10-CM | POA: Insufficient documentation

## 2018-09-16 MED ORDER — MOMETASONE FURO-FORMOTEROL FUM 100-5 MCG/ACT IN AERO: 2.0000 | INHALATION_SPRAY | Freq: Two times a day (BID) | RESPIRATORY_TRACT | 5 refills | Status: DC

## 2018-09-16 MED ORDER — MONTELUKAST SODIUM 10 MG PO TABS: 10.0000 mg | ORAL_TABLET | Freq: Every day | ORAL | 5 refills | Status: DC

## 2018-09-16 MED ORDER — FLUTICASONE PROPIONATE 93 MCG/ACT NA EXHU: 1.0000 | INHALANT_SUSPENSION | Freq: Two times a day (BID) | NASAL | 5 refills | Status: DC

## 2018-09-16 NOTE — Patient Instructions (Addendum)
Today's testing showed: Positive to mold and cockroach. Bloodwork was positive to mold and dust mites.  Start Singulair 10mg  daily. May use over the counter antihistamines such as Zyrtec (cetirizine), Claritin (loratadine), Allegra (fexofenadine), or Xyzal (levocetirizine) daily as needed.  Start Xhance 2 sprays twice a day. Demonstrated proper use. This is to replace the Flonase.  . Daily controller medication(s): dulera 100 2 puffs twice a day with spacer and rinse mouth afterwards. . Prior to physical activity: May use albuterol rescue inhaler 2 puffs 5 to 15 minutes prior to strenuous physical activities. Marland Kitchen. Rescue medications: May use albuterol rescue inhaler 2 puffs or nebulizer every 4 to 6 hours as needed for shortness of breath, chest tightness, coughing, and wheezing. Monitor frequency of use.  . Asthma control goals:  o Full participation in all desired activities (may need albuterol before activity) o Albuterol use two times or less a week on average (not counting use with activity) o Cough interfering with sleep two times or less a month o Oral steroids no more than once a year o No hospitalizations   Follow up in 4 weeks  Skin care recommendations  Bath time: . Always use lukewarm water. AVOID very hot or cold water. Marland Kitchen. Keep bathing time to 5-10 minutes. . Do NOT use bubble bath. . Use a mild soap and use just enough to wash the dirty areas. . Do NOT scrub skin vigorously.  . After bathing, pat dry your skin with a towel. Do NOT rub or scrub the skin.  Moisturizers and prescriptions:  . ALWAYS apply moisturizers immediately after bathing (within 3 minutes). This helps to lock-in moisture. . Use the moisturizer several times a day over the whole body. Peri Jefferson. Good summer moisturizers include: Aveeno, CeraVe, Cetaphil. Peri Jefferson. Good winter moisturizers include: Aquaphor, Vaseline, Cerave, Cetaphil, Eucerin, Vanicream. . When using moisturizers along with medications, the  moisturizer should be applied about one hour after applying the medication to prevent diluting effect of the medication or moisturize around where you applied the medications. When not using medications, the moisturizer can be continued twice daily as maintenance.  Laundry and clothing: . Avoid laundry products with added color or perfumes. . Use unscented hypo-allergenic laundry products such as Tide free, Cheer free & gentle, and All free and clear.  . If the skin still seems dry or sensitive, you can try double-rinsing the clothes. . Avoid tight or scratchy clothing such as wool. . Do not use fabric softeners or dyer sheets.  Control of House Dust Mite Allergen . Dust mite allergens are a common trigger of allergy and asthma symptoms. While they can be found throughout the house, these microscopic creatures thrive in warm, humid environments such as bedding, upholstered furniture and carpeting. . Because so much time is spent in the bedroom, it is essential to reduce mite levels there.  . Encase pillows, mattresses, and box springs in special allergen-proof fabric covers or airtight, zippered plastic covers.  . Bedding should be washed weekly in hot water (130 F) and dried in a hot dryer. Allergen-proof covers are available for comforters and pillows that can't be regularly washed.  Mckenzie [name]. Wash the allergy-proof covers every few months. Minimize clutter in the bedroom. Keep pets out of the bedroom.  Marland Kitchen. Keep humidity less than 50% by using a dehumidifier or air conditioning. You can buy a humidity measuring device called a hygrometer to monitor this.  . If possible, replace carpets with hardwood, linoleum, or washable area rugs. If that's not  possible, vacuum frequently with a vacuum that has a HEPA filter. . Remove all upholstered furniture and non-washable window drapes from the bedroom. . Remove all non-washable stuffed toys from the bedroom.  Wash stuffed toys weekly. Mold Control . Mold and  fungi can grow on a variety of surfaces provided certain temperature and moisture conditions exist.  . Outdoor molds grow on plants, decaying vegetation and soil. The major outdoor mold, Alternaria and Cladosporium, are found in very high numbers during hot and dry conditions. Generally, a late summer - fall peak is seen for common outdoor fungal spores. Rain will temporarily lower outdoor mold spore count, but counts rise rapidly when the rainy period ends. . The most important indoor molds are Aspergillus and Penicillium. Dark, humid and poorly ventilated basements are ideal sites for mold growth. The next most common sites of mold growth are the bathroom and the kitchen. Outdoor (Seasonal) Mold Control . Use air conditioning and keep windows closed. . Avoid exposure to decaying vegetation. Marland Kitchen Avoid leaf raking. . Avoid grain handling. . Consider wearing a face mask if working in moldy areas.  Indoor (Perennial) Mold Control  . Maintain humidity below 50%. . Get rid of mold growth on hard surfaces with water, detergent and, if necessary, 5% bleach (do not mix with other cleaners). Then dry the area completely. If mold covers an area more than 10 square feet, consider hiring an indoor environmental professional. . For clothing, washing with soap and water is best. If moldy items cannot be cleaned and dried, throw them away. . Remove sources e.g. contaminated carpets. . Repair and seal leaking roofs or pipes. Using dehumidifiers in damp basements may be helpful, but empty the water and clean units regularly to prevent mildew from forming. All rooms, especially basements, bathrooms and kitchens, require ventilation and cleaning to deter mold and mildew growth. Avoid carpeting on concrete or damp floors, and storing items in damp areas.  Cockroach Allergen Avoidance Cockroaches are often found in the homes of densely populated urban areas, schools or commercial buildings, but these creatures can lurk  almost anywhere. This does not mean that you have a dirty house or living area. . Block all areas where roaches can enter the home. This includes crevices, wall cracks and windows.  . Cockroaches need water to survive, so fix and seal all leaky faucets and pipes. Have an exterminator go through the house when your family and pets are gone to eliminate any remaining roaches. Marland Kitchen Keep food in lidded containers and put pet food dishes away after your pets are done eating. Vacuum and sweep the floor after meals, and take out garbage and recyclables. Use lidded garbage containers in the kitchen. Wash dishes immediately after use and clean under stoves, refrigerators or toasters where crumbs can accumulate. Wipe off the stove and other kitchen surfaces and cupboards regularly.

## 2018-09-16 NOTE — Assessment & Plan Note (Signed)
Perennial rhinoconjunctivitis symptoms for the past unknown years but worse the last year.  Has tried Zyrtec and Flonase with minimal benefit.  2019 Immunocap was positive to mold and dust mites.  Today's skin testing was positive to molds and cockroaches.  Discussed environmental control measures.  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  Start singulair 10mg  daily.  May use over the counter antihistamines such as Zyrtec (cetirizine), Claritin (loratadine), Allegra (fexofenadine), or Xyzal (levocetirizine) daily as needed.  Start Xhance 2 sprays twice a day. Demonstrated proper use and sample given. This is to replace the Flonase.

## 2018-09-16 NOTE — Assessment & Plan Note (Addendum)
Respiratory symptoms for unknown years but worse the last year which seems to flare with her allergies and exertion.  Has been using albuterol on a daily basis with some benefit.  Today's spirometry was normal and there was a 30% improvement post bronchodilator treatment however this may have been due to improved technique.  She did feel clinically better after the treatment concerning for reactive airway disease/asthma.  . Daily controller medication(s):  Start Dulera 100 2 puffs twice a day with spacer and rinse mouth afterwards.  Demonstrated proper use and sample was given. . Prior to physical activity: May use albuterol rescue inhaler 2 puffs 5 to 15 minutes prior to strenuous physical activities. Marland Kitchen. Rescue medications: May use albuterol rescue inhaler 2 puffs or nebulizer every 4 to 6 hours as needed for shortness of breath, chest tightness, coughing, and wheezing. Monitor frequency of use.

## 2018-09-16 NOTE — Assessment & Plan Note (Signed)
Mild flares on and off on legs and arms.  Discussed proper skin care measures.

## 2018-09-16 NOTE — Progress Notes (Signed)
New Patient Note  RE: Mckenzie [name] MRN: [id number] DOB: 1986/07/22 Date of Office Visit: 09/16/2018  Referring provider: Loletta Specter, PA-C Primary care provider: Loletta Specter, PA-C  Chief Complaint: Allergic Rhinitis  and Wheezing  History of Present Illness: I had the pleasure of seeing Mckenzie [name] for initial evaluation at the [facility] on 09/16/2018. She is a 32 y.o. [name], who is referred here by Loletta Specter, PA-C for the evaluation of allergic rhinitis and wheezing.  Allergic rhinitis: She reports symptoms of itchy/watery eyes, coughing, nasal congestion, sneezing, itchy nose. Symptoms have been going on for many years but worse the past year. The symptoms are present all year around. Anosmia: no. Headache: yes. She has used zyrtec and Flonase 2 sprays once a day, opcon-A with minimal improvement in symptoms. Sinus infections: no. Previous work up includes: 2019 immunocap was positive to mold, dust mites. Previous ENT evaluation: no.  Wheezing: She reports symptoms of chest tightness, shortness of breath, coughing, wheezing, nocturnal awakenings for many years but worse the past year. Current medications include albuterol prn which help. She reports using aerochamber with inhalers. She tried the following inhalers: albuterol nebulizer. Main triggers are allergies, exercise. In the last month, frequency of symptoms: daily. Frequency of nocturnal symptoms: 0x/month. Frequency of SABA use: daily. Interference with physical activity: yes. Sleep is undisturbed. In the last 12 months, emergency room visits/urgent care visits/doctor office visits or hospitalizations due to respiratory issues: once. In the last 12 months, oral steroids courses: once Lifetime history of hospitalization for respiratory issues: no. Prior intubations: no. History of pneumonia: twice. She was not evaluated by allergist/pulmonologist in the past. Smoking exposure: no. Up  to date with flu vaccine: no.   Assessment and Plan: Mckenzie [name] is a 32 y.o. [name] with: Other allergic rhinitis Perennial rhinoconjunctivitis symptoms for the past unknown years but worse the last year.  Has tried Zyrtec and Flonase with minimal benefit.  2019 Immunocap was positive to mold and dust mites.  Today's skin testing was positive to molds and cockroaches.  Discussed environmental control measures.  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  Start singulair 10mg  daily.  May use over the counter antihistamines such as Zyrtec (cetirizine), Claritin (loratadine), Allegra (fexofenadine), or Xyzal (levocetirizine) daily as needed.  Start Xhance 2 sprays twice a day. Demonstrated proper use and sample given. This is to replace the Flonase.  Wheezing Respiratory symptoms for unknown years but worse the last year which seems to flare with her allergies and exertion.  Has been using albuterol on a daily basis with some benefit.  Today's spirometry was normal and there was a 30% improvement post bronchodilator treatment however this may have been due to improved technique.  She did feel clinically better after the treatment concerning for reactive airway disease/asthma.  . Daily controller medication(s):  Start Dulera 100 2 puffs twice a day with spacer and rinse mouth afterwards.  Demonstrated proper use and sample was given. . Prior to physical activity: May use albuterol rescue inhaler 2 puffs 5 to 15 minutes prior to strenuous physical activities. Marland Kitchen Rescue medications: May use albuterol rescue inhaler 2 puffs or nebulizer every 4 to 6 hours as needed for shortness of breath, chest tightness, coughing, and wheezing. Monitor frequency of use.   Other atopic dermatitis Mild flares on and off on legs and arms.  Discussed proper skin care measures.  Return in about 4  weeks (around 10/14/2018).  Meds ordered this encounter  Medications  .  Fluticasone Propionate (XHANCE) 93 MCG/ACT EXHU    Sig: Place 1 spray into the nose 2 (two) times daily.    Dispense:  16 mL    Refill:  5  . mometasone-formoterol (DULERA) 100-5 MCG/ACT AERO    Sig: Inhale 2 puffs into the lungs 2 (two) times daily.    Dispense:  1 Inhaler    Refill:  5  . montelukast (SINGULAIR) 10 MG tablet    Sig: Take 1 tablet (10 mg total) by mouth at bedtime.    Dispense:  30 tablet    Refill:  5   Other allergy screening: Asthma: yes Rhino conjunctivitis: yes Food allergy: no Medication allergy: no Hymenoptera allergy: no Urticaria: no Eczema: yes on arms and leg. Uses topical OTC hydrocortisone cream with good benefit.  History of recurrent infections suggestive of immunodeficency: no  Diagnostics: Spirometry:  Tracings reviewed. Her effort: It was hard to get consistent efforts and there is a question as to whether this reflects a maximal maneuver. FVC: 3.23L FEV1: 2.47L, 86% predicted FEV1/FVC ratio: 76% Interpretation: Spirometry consistent with normal pattern and 31% improvement post bronchodilator treatment which may have been due to improved technique. Please see scanned spirometry results for details.  Skin Testing: Positive test to: mold and cockroach. Results discussed with patient/family. Airborne Adult Perc - 09/16/18 0929    Allergen Manufacturer  Waynette Buttery    Location  Back    Number of Test  59    Panel 1  Select    1. Control-Buffer 50% Glycerol  Negative    2. Control-Histamine 1 mg/ml  4+    3. Albumin saline  Negative    4. Bahia  Negative    5. [place]  Negative    6. Johnson  Negative    7. Kentucky Blue  Negative    8. Meadow Fescue  Negative    9. Perennial Rye  Negative    10. Sweet Vernal  Negative    11. Timothy  Negative    12. Cocklebur  Negative    13. Burweed Marshelder  Negative    14. Ragweed, short  Negative    15. Ragweed, Giant  Negative    16. Plantain,  English  Negative    17. Lamb's Quarters  Negative     18. Sheep Sorrell  Negative    19. Rough Pigweed  Negative    20. Marsh Elder, Rough  Negative    21. Mugwort, Common  Negative    22. Ash mix  Negative    23. Birch mix  Negative    24. Beech American  Negative    25. Box, Elder  Negative    26. Cedar, red  Negative    27. Cottonwood, Guinea-Bissau  Negative    28. Elm mix  Negative    29. Hickory mix  Negative    30. Maple mix  Negative    31. Oak, Guinea-Bissau mix  Negative    32. Pecan Pollen  Negative    33. Pine mix  Negative    34. Sycamore Eastern  Negative    35. Walnut, Black Pollen  Negative    36. Alternaria alternata  4+    37. Cladosporium Herbarum  4+    38. Aspergillus mix  4+    39. Penicillium mix  4+    40. Bipolaris sorokiniana (Helminthosporium)  Negative    41. Drechslera spicifera (Curvularia)  Negative    42. Mucor plumbeus  Negative    43. Fusarium moniliforme  Negative    44. Aureobasidium pullulans (pullulara)  4+    45. Rhizopus oryzae  Negative    46. Botrytis cinera  4+    47. Epicoccum nigrum  Negative    48. Phoma betae  Negative    49. Candida Albicans  4+    50. Trichophyton mentagrophytes  Negative    51. Mite, D Farinae  5,000 AU/ml  Negative    52. Mite, D Pteronyssinus  5,000 AU/ml  Negative    53. Cat Hair 10,000 BAU/ml  Negative    54.  Dog Epithelia  Negative    55. Mixed Feathers  Negative    56. Horse Epithelia  Negative    57. Cockroach, German  4+    58. Mouse  Negative    59. Tobacco Leaf  Negative     Food Adult Perc - 09/16/18 0928    Allergen Manufacturer  Waynette Buttery    Location  Back    Number of allergen test  12     Control-buffer 50% Glycerol  Negative    Control-Histamine 1 mg/ml  4+    1. Peanut  Negative    2. Soybean  Negative    3. Wheat  Negative    4. Sesame  Negative    5. Milk, cow  Negative    6. Egg White, Chicken  Negative    7. Casein  Negative    8. Shellfish Mix  Negative    9. Fish Mix  Negative    10. Cashew  Negative       Past Medical  History: Patient Active Problem List   Diagnosis Date Noted  . Other allergic rhinitis 09/16/2018  . Wheezing 09/16/2018  . Other atopic dermatitis 09/16/2018   History reviewed. No pertinent past medical history. Past Surgical History: History reviewed. No pertinent surgical history. Medication List:  Current Outpatient Medications  Medication Sig Dispense Refill  . albuterol (PROVENTIL HFA;VENTOLIN HFA) 108 (90 Base) MCG/ACT inhaler Inhale 2 puffs into the lungs every 6 (six) hours as needed for wheezing or shortness of breath. 1 Inhaler 0  . cetirizine (ZYRTEC) 10 MG tablet Take 1 tablet (10 mg total) by mouth daily. 30 tablet 3  . Naphazoline-Pheniramine (OPCON-A) 0.027-0.315 % SOLN Apply 2 drops to eye daily.    . Fluticasone Propionate (XHANCE) 93 MCG/ACT EXHU Place 1 spray into the nose 2 (two) times daily. 16 mL 5  . mometasone-formoterol (DULERA) 100-5 MCG/ACT AERO Inhale 2 puffs into the lungs 2 (two) times daily. 1 Inhaler 5  . montelukast (SINGULAIR) 10 MG tablet Take 1 tablet (10 mg total) by mouth at bedtime. 30 tablet 5   No current facility-administered medications for this visit.    Allergies: No Known Allergies Social History: Social History   Socioeconomic History  . Marital status: Single    Spouse name: Not on file  . Number of children: Not on file  . Years of education: Not on file  . Highest education level: Not on file  Occupational History  . Not on file  Social Needs  . Financial resource strain: Not on file  . Food insecurity:    Worry: Not on file    Inability: Not on file  . Transportation needs:    Medical: Not on file    Non-medical: Not on file  Tobacco Use  . Smoking status: Never Smoker  . Smokeless tobacco:  Never Used  Substance and Sexual Activity  . Alcohol use: Never    Frequency: Never  . Drug use: Never  . Sexual activity: Never  Lifestyle  . Physical activity:    Days per week: Not on file    Minutes per session: Not on  file  . Stress: Not on file  Relationships  . Social connections:    Talks on phone: Not on file    Gets together: Not on file    Attends religious service: Not on file    Active member of club or organization: Not on file    Attends meetings of clubs or organizations: Not on file    Relationship status: Not on file  Other Topics Concern  . Not on file  Social History Narrative  . Not on file   Lives in a house. Smoking: denies Occupation: Equities traderCNA  Environmental HistorySurveyor, minerals: Water Damage/mildew in the house: no Carpet in the family room: no Carpet in the bedroom: yes Heating: gas Cooling: central Pet: no  Family History: History reviewed. No pertinent family history. Problem                               Relation Asthma                                   Mother  Eczema                                Mother  Food allergy                          No  Allergic rhino conjunctivitis     Unsure   Review of Systems  Constitutional: Negative for appetite change, chills, fever and unexpected weight change.  HENT: Positive for congestion and rhinorrhea.   Eyes: Positive for itching.  Respiratory: Positive for cough, chest tightness, shortness of breath and wheezing.   Cardiovascular: Negative for chest pain.  Gastrointestinal: Negative for abdominal pain.  Genitourinary: Negative for difficulty urinating.  Skin: Positive for rash.  Allergic/Immunologic: Positive for environmental allergies. Negative for food allergies.  Neurological: Positive for headaches.   Objective: BP 102/70   Pulse 62   Resp 16   Ht 5\' 6"  (1.676 m)   Wt 254 lb 6.4 oz (115.4 kg)   SpO2 99%   BMI 41.06 kg/m  Body mass index is 41.06 kg/m. Physical Exam  Constitutional: She is oriented to person, place, and time. She appears well-developed and well-nourished.  HENT:  Head: Normocephalic and atraumatic.  Right Ear: External ear normal.  Left Ear: External ear normal.  Nose: Mucosal edema present.   Mouth/Throat: Oropharynx is clear and moist.  Eyes: Conjunctivae and EOM are normal.  Neck: Neck supple.  Cardiovascular: Normal rate, regular rhythm and normal heart sounds. Exam reveals no gallop and no friction rub.  No murmur heard. Pulmonary/Chest: Effort normal and breath sounds normal. She has no wheezes. She has no rales.  Abdominal: Soft.  Lymphadenopathy:    She has no cervical adenopathy.  Neurological: She is alert and oriented to person, place, and time.  Skin: Skin is warm and dry. No rash noted.  Dry patch on right anterior lower thigh area.  Psychiatric: She has a normal mood and  affect. Her behavior is normal.  Nursing note and vitals reviewed.  The plan was reviewed with the patient/family, and all questions/concerned were addressed.  It was my pleasure to see Mrytle today and participate in her care. Please feel free to contact me with any questions or concerns.  Sincerely,  Wyline Mood, DO Allergy & Immunology  [facility] office: [phone] [facility] office:[phone]

## 2018-10-14 ENCOUNTER — Encounter: Payer: Self-pay | Admitting: Allergy

## 2018-10-14 ENCOUNTER — Ambulatory Visit (INDEPENDENT_AMBULATORY_CARE_PROVIDER_SITE_OTHER): Payer: Medicaid Other | Admitting: Allergy

## 2018-10-14 VITALS — BP 124/72 | HR 84 | Resp 16

## 2018-10-14 DIAGNOSIS — R062 Wheezing: Secondary | ICD-10-CM

## 2018-10-14 DIAGNOSIS — J3089 Other allergic rhinitis: Secondary | ICD-10-CM | POA: Diagnosis not present

## 2018-10-14 DIAGNOSIS — L2089 Other atopic dermatitis: Secondary | ICD-10-CM

## 2018-10-14 MED ORDER — MONTELUKAST SODIUM 10 MG PO TABS: 10.0000 mg | ORAL_TABLET | Freq: Every day | ORAL | 5 refills | Status: DC

## 2018-10-14 MED ORDER — FLUTICASONE PROPIONATE 93 MCG/ACT NA EXHU
2.0000 | INHALANT_SUSPENSION | Freq: Two times a day (BID) | NASAL | 5 refills | Status: DC
Start: 2018-10-14 — End: 2018-10-14

## 2018-10-14 MED ORDER — MOMETASONE FURO-FORMOTEROL FUM 100-5 MCG/ACT IN AERO: 2.0000 | INHALATION_SPRAY | Freq: Two times a day (BID) | RESPIRATORY_TRACT | 5 refills | Status: DC

## 2018-10-14 MED ORDER — FLUTICASONE PROPIONATE 93 MCG/ACT NA EXHU: 2.0000 | INHALANT_SUSPENSION | Freq: Two times a day (BID) | NASAL | 5 refills | Status: DC

## 2018-10-14 NOTE — Assessment & Plan Note (Signed)
Past history - Perennial rhinoconjunctivitis symptoms for the past unknown years but worse the last year.  Has tried Zyrtec and Flonase with minimal benefit.  2019 Immunocap was positive to mold and dust mites. 2019 skin testing was positive to molds and cockroaches.   Interim history - some benefit with Xhance but was not able to get full prescription and did not start Singulair.  Continue environmental control measures.    Start singulair 10mg  daily.  May use over the counter antihistamines such as Zyrtec (cetirizine), Claritin (loratadine), Allegra (fexofenadine), or Xyzal (levocetirizine) daily as needed.  Continue Xhance 2 sprays twice a day.

## 2018-10-14 NOTE — Progress Notes (Signed)
Follow Up Note  RE: Mckenzie [name] MRN: [id number] DOB: [birth year] Date of Office Visit: 10/14/2018  Referring provider: Loletta Specter, PA-C Primary care provider: Loletta Specter, PA-C  Chief Complaint: No chief complaint on file.  History of Present Illness: I had the pleasure of seeing Mckenzie [name] for a follow up visit at the [facility] on 10/14/2018. She is a 33 y.o. female, who is being followed for allergic rhinitis, wheezing, atopic dermatitis. Today she is here for regular follow up visit. Her previous allergy office visit was on 09/16/2018 with Dr. Selena Batten.   Allergic rhinitis: Was using Xhance 1 spray twice a day with some benefit. Still taking zyrtec 10mg  daily but was not able to fill Singulair as it was sent to the wrong pharmacy.   Wheezing: Currently on Dulera 2 puffs once a day and did not notice any significant difference. Initially used albuterol twice a day but now using only as needed and not sure if it's making any improvement in her symptoms.   Other atopic dermatitis: No flares since last visit.  Assessment and Plan: Kathlyne is a 33 y.o. female with: Other allergic rhinitis Past history - Perennial rhinoconjunctivitis symptoms for the past unknown years but worse the last year.  Has tried Zyrtec and Flonase with minimal benefit.  2019 Immunocap was positive to mold and dust mites. 2019 skin testing was positive to molds and cockroaches.   Interim history - some benefit with Xhance but was not able to get full prescription and did not start Singulair.  Continue environmental control measures.    Start singulair 10mg  daily.  May use over the counter antihistamines such as Zyrtec (cetirizine), Claritin (loratadine), Allegra (fexofenadine), or Xyzal (levocetirizine) daily as needed.  Continue Xhance 2 sprays twice a day.  Wheezing Past history - Respiratory symptoms for unknown years but worse the last year which seems to flare with  her allergies and exertion.  Has been using albuterol on a daily basis with some benefit. Interim history - Used Dulera 100 2 puffs once a day instead of twice a day due to prescription issues with borderline benefit.  Today's spirometry was normal but she used albuterol 1-2 hours before visit. Daily controller medication(s): Dulera 100 2 puffs twice a day with spacer and rinse mouth afterwards Prior to physical activity: May use albuterol rescue inhaler 2 puffs 5 to 15 minutes prior to strenuous physical activities. Rescue medications: May use albuterol rescue inhaler 2 puffs or nebulizer every 4 to 6 hours as needed for shortness of breath, chest tightness, coughing, and wheezing. Monitor frequency of use.   Other atopic dermatitis Well-controlled.   Continue proper skin care measures.  Return in about 2 months (around 12/13/2018).  Meds ordered this encounter  Medications  . montelukast (SINGULAIR) 10 MG tablet    Sig: Take 1 tablet (10 mg total) by mouth at bedtime.    Dispense:  30 tablet    Refill:  5  . mometasone-formoterol (DULERA) 100-5 MCG/ACT AERO    Sig: Inhale 2 puffs into the lungs 2 (two) times daily.    Dispense:  1 Inhaler    Refill:  5  . Fluticasone Propionate (XHANCE) 93 MCG/ACT EXHU    Sig: Place 2 sprays into the nose 2 (two) times daily.    Dispense:  16 mL    Refill:  5   Diagnostics: Spirometry:  Tracings reviewed. Her effort: Good reproducible efforts. FVC: 4.10L FEV1: 3.28L, 114% predicted FEV1/FVC  ratio: 80% Interpretation: Spirometry consistent with normal pattern - patient used albuterol 1-2 hours before visit. Please see scanned spirometry results for details.  Medication List:  Current Outpatient Medications  Medication Sig Dispense Refill  . albuterol (PROVENTIL HFA;VENTOLIN HFA) 108 (90 Base) MCG/ACT inhaler Inhale 2 puffs into the lungs every 6 (six) hours as needed for wheezing or shortness of breath. 1 Inhaler 0  . cetirizine (ZYRTEC) 10  MG tablet Take 1 tablet (10 mg total) by mouth daily. 30 tablet 3  . Fluticasone Propionate (XHANCE) 93 MCG/ACT EXHU Place 2 sprays into the nose 2 (two) times daily. 16 mL 5  . mometasone-formoterol (DULERA) 100-5 MCG/ACT AERO Inhale 2 puffs into the lungs 2 (two) times daily. 1 Inhaler 5  . Naphazoline-Pheniramine (OPCON-A) 0.027-0.315 % SOLN Apply 2 drops to eye daily.    . montelukast (SINGULAIR) 10 MG tablet Take 1 tablet (10 mg total) by mouth at bedtime. 30 tablet 5   No current facility-administered medications for this visit.    Allergies: No Known Allergies I reviewed her past medical history, social history, family history, and environmental history and no significant changes have been reported from previous visit on 09/16/2018.  Review of Systems  Constitutional: Negative for appetite change, chills, fever and unexpected weight change.  HENT: Positive for congestion and rhinorrhea.   Eyes: Positive for itching.  Respiratory: Positive for cough, shortness of breath and wheezing. Negative for chest tightness.   Cardiovascular: Negative for chest pain.  Gastrointestinal: Negative for abdominal pain.  Genitourinary: Negative for difficulty urinating.  Skin: Negative for rash.  Allergic/Immunologic: Positive for environmental allergies. Negative for food allergies.  Neurological: Negative for headaches.   Objective: BP 124/72 (BP Location: Left Arm)   Pulse 84   Resp 16   SpO2 92%  There is no height or weight on file to calculate BMI. Physical Exam  Constitutional: She is oriented to person, place, and time. She appears well-developed and well-nourished.  HENT:  Head: Normocephalic and atraumatic.  Right Ear: External ear normal.  Left Ear: External ear normal.  Nose: Mucosal edema present.  Mouth/Throat: Oropharynx is clear and moist.  Eyes: Conjunctivae and EOM are normal.  Neck: Neck supple.  Cardiovascular: Normal rate, regular rhythm and normal heart sounds. Exam  reveals no gallop and no friction rub.  No murmur heard. Pulmonary/Chest: Effort normal and breath sounds normal. She has no wheezes. She has no rales.  Abdominal: Soft.  Lymphadenopathy:    She has no cervical adenopathy.  Neurological: She is alert and oriented to person, place, and time.  Skin: Skin is warm. No rash noted.  Psychiatric: She has a normal mood and affect. Her behavior is normal.  Nursing note and vitals reviewed.  Previous notes and tests were reviewed. The plan was reviewed with the patient/family, and all questions/concerned were addressed.  It was my pleasure to see Elaena today and participate in her care. Please feel free to contact me with any questions or concerns.  Sincerely,  Wyline Mood, DO Allergy & Immunology  [facility] office: [phone] [facility] office: [phone]

## 2018-10-14 NOTE — Patient Instructions (Addendum)
Other allergic rhinitis  Continue environmental control measures to molds and cockroaches.  Start singulair 10mg  daily. May use over the counter antihistamines such as Zyrtec (cetirizine), Claritin (loratadine), Allegra (fexofenadine), or Xyzal (levocetirizine) daily as needed. Continue Xhance 2 sprays twice a day. Demonstrated proper use and sample given. This is to replace the Flonase.  Wheezing  Daily controller medication(s): Dulera 100 2 puffs twice a day with spacer and rinse mouth afterwards Prior to physical activity: May use albuterol rescue inhaler 2 puffs 5 to 15 minutes prior to strenuous physical activities. Rescue medications: May use albuterol rescue inhaler 2 puffs or nebulizer every 4 to 6 hours as needed for shortness of breath, chest tightness, coughing, and wheezing. Monitor frequency of use.  Asthma control goals:  Full participation in all desired activities (may need albuterol before activity) Albuterol use two times or less a week on average (not counting use with activity) Cough interfering with sleep two times or less a month Oral steroids no more than once a year No hospitalizations  Other atopic dermatitis  Continue skin care measures.  Follow up in 2 months

## 2018-10-14 NOTE — Assessment & Plan Note (Signed)
Past history - Respiratory symptoms for unknown years but worse the last year which seems to flare with her allergies and exertion.  Has been using albuterol on a daily basis with some benefit. Interim history - Used Dulera 100 2 puffs once a day instead of twice a day due to prescription issues with borderline benefit.  Today's spirometry was normal but she used albuterol 1-2 hours before visit. Daily controller medication(s): Dulera 100 2 puffs twice a day with spacer and rinse mouth afterwards Prior to physical activity: May use albuterol rescue inhaler 2 puffs 5 to 15 minutes prior to strenuous physical activities. Rescue medications: May use albuterol rescue inhaler 2 puffs or nebulizer every 4 to 6 hours as needed for shortness of breath, chest tightness, coughing, and wheezing. Monitor frequency of use.

## 2018-10-14 NOTE — Assessment & Plan Note (Signed)
Well-controlled. Continue proper skin care measures. 

## 2018-11-05 ENCOUNTER — Telehealth: Payer: Self-pay | Admitting: *Deleted

## 2018-11-05 NOTE — Telephone Encounter (Signed)
Patient called stating the prescription that Dr Delorse Lek sent in for her is not covered. Please advise [phone]

## 2018-11-05 NOTE — Telephone Encounter (Signed)
Called and talked with the patient and the patient stated that [employer] stated that a PA needed to be initiated. A PA has been completed and approved for Xhance. The PA form has been faxed to [employer], labeled, and placed in bulk scanning. Called the patient and informed that the PA has been approved. Patient verbalized understanding.

## 2018-12-17 ENCOUNTER — Ambulatory Visit (HOSPITAL_COMMUNITY)
Admission: RE | Admit: 2018-12-17 | Discharge: 2018-12-17 | Disposition: A | Discharge status: Home / Self Care | Payer: Medicaid Other | Source: Ambulatory Visit | Attending: Emergency Medicine | Admitting: Emergency Medicine

## 2018-12-17 ENCOUNTER — Emergency Department (HOSPITAL_COMMUNITY)
Admission: EM | Admit: 2018-12-17 | Discharge: 2018-12-17 | Disposition: A | Discharge status: Home / Self Care | Payer: Medicaid Other | Attending: Emergency Medicine | Admitting: Emergency Medicine

## 2018-12-17 ENCOUNTER — Other Ambulatory Visit (HOSPITAL_COMMUNITY): Payer: Self-pay | Admitting: Emergency Medicine

## 2018-12-17 DIAGNOSIS — R05 Cough: Secondary | ICD-10-CM | POA: Diagnosis not present

## 2018-12-17 DIAGNOSIS — R509 Fever, unspecified: Secondary | ICD-10-CM | POA: Diagnosis not present

## 2018-12-17 DIAGNOSIS — J111 Influenza due to unidentified influenza virus with other respiratory manifestations: Secondary | ICD-10-CM | POA: Diagnosis not present

## 2018-12-17 DIAGNOSIS — R0602 Shortness of breath: Secondary | ICD-10-CM | POA: Diagnosis not present

## 2018-12-17 MED ORDER — OSELTAMIVIR PHOSPHATE 75 MG PO CAPS: 75.0000 mg | ORAL_CAPSULE | Freq: Two times a day (BID) | ORAL | 0 refills | Status: DC

## 2018-12-17 NOTE — Discharge Instructions (Addendum)
You were evaluated in the Emergency Department and after careful evaluation, we did not find any emergent condition requiring admission or further testing in the hospital.  Your symptoms today seem to be due to the flu.  Please take the flu medication as directed.  For discomfort at home you can use Tylenol or Profen.  Please drink plenty of fluids.  Please return to the Emergency Department if you experience any worsening of your condition.  We encourage you to follow up with a primary care provider.  Thank you for allowing [place] to be a part of your care.

## 2018-12-17 NOTE — ED Triage Notes (Signed)
See downtime paperwork.

## 2018-12-17 NOTE — ED Provider Notes (Signed)
[facility] Emergency Department Provider Note MRN:  [id number]  Arrival date & time: 12/17/18     Chief Complaint   Flulike symptoms History of Present Illness   Mckenzie [name] is a 33 y.o. year-old female with no pertinent past medical history presenting to the ED with chief complaint of flulike symptoms.  Began feeling ill yesterday with fever up to 102.  Then began experiencing diffuse body aches, general malaise, cough, nasal congestion.  Denies sore throat, no neck pain, no chest pain, no shortness of breath, no abdominal pain, no dysuria.  Symptoms are moderate, no exacerbating relieving factors.  Has not tried anything for the symptoms.  Review of Systems  A complete 10 system review of systems was obtained and all systems are negative except as noted in the HPI and PMH.   Patient's Health History   No past medical history on file.    No family history on file.  Social History   Socioeconomic History  . Marital status: Not on file    Spouse name: Not on file  . Number of children: Not on file  . Years of education: Not on file  . Highest education level: Not on file  Occupational History  . Not on file  Social Needs  . Financial resource strain: Not on file  . Food insecurity:    Worry: Not on file    Inability: Not on file  . Transportation needs:    Medical: Not on file    Non-medical: Not on file  Tobacco Use  . Smoking status: Not on file  Substance and Sexual Activity  . Alcohol use: Not on file  . Drug use: Not on file  . Sexual activity: Not on file  Lifestyle  . Physical activity:    Days per week: Not on file    Minutes per session: Not on file  . Stress: Not on file  Relationships  . Social connections:    Talks on phone: Not on file    Gets together: Not on file    Attends religious service: Not on file    Active member of club or organization: Not on file    Attends meetings of clubs or organizations: Not on file   Relationship status: Not on file  . Intimate partner violence:    Fear of current or ex partner: Not on file    Emotionally abused: Not on file    Physically abused: Not on file    Forced sexual activity: Not on file  Other Topics Concern  . Not on file  Social History Narrative  . Not on file     Physical Exam  Vital Signs and Nursing Notes reviewed Vitals:   12/17/18 1129  BP: 110/64  Pulse: 91  Resp: 18  Temp: (!) 102.6 F (39.2 C)  SpO2: 99%    CONSTITUTIONAL: Well-appearing, NAD NEURO:  Alert and oriented x 3, no focal deficits, no meningismus EYES:  eyes equal and reactive ENT/NECK:  no LAD, no JVD CARDIO: Regular rate, well-perfused, normal S1 and S2 PULM:  CTAB no wheezing or rhonchi GI/GU:  normal bowel sounds, non-distended, non-tender MSK/SPINE:  No gross deformities, no edema SKIN:  no rash, atraumatic PSYCH:  Appropriate speech and behavior  Diagnostic and Interventional Summary    Labs Reviewed - No data to display  No orders to display    Medications - No data to display   Procedures Critical Care  ED Course and Medical Decision Making  I have reviewed the triage vital signs and the nursing notes.  Pertinent labs & imaging results that were available during my care of the patient were reviewed by me and considered in my medical decision making (see below for details).  Consistent with influenza based on history, well-appearing on exam, normal vital signs, clear lungs, benign abdomen.  Will treat empirically with Tamiflu.  After the discussed management above, the patient was determined to be safe for discharge.  The patient was in agreement with this plan and all questions regarding their care were answered.  ED return precautions were discussed and the patient will return to the ED with any significant worsening of condition.  Elmer Sow. Pilar Plate, MD [facility] Emergency Medicine [facility] [email or web address]  Final Clinical  Impressions(s) / ED Diagnoses     ICD-10-CM   1. Influenza J11.1     ED Discharge Orders         Ordered    oseltamivir (TAMIFLU) 75 MG capsule  Every 12 hours     12/17/18 1202             Sabas Sous, MD 12/17/18 1204

## 2019-04-28 ENCOUNTER — Encounter: Payer: Self-pay | Admitting: Allergy

## 2019-04-28 ENCOUNTER — Ambulatory Visit (INDEPENDENT_AMBULATORY_CARE_PROVIDER_SITE_OTHER): Payer: Medicaid Other | Admitting: Allergy

## 2019-04-28 ENCOUNTER — Other Ambulatory Visit: Payer: Self-pay

## 2019-04-28 VITALS — BP 130/90 | HR 70 | Temp 98.2°F | Resp 18

## 2019-04-28 DIAGNOSIS — L2089 Other atopic dermatitis: Secondary | ICD-10-CM

## 2019-04-28 DIAGNOSIS — J4540 Moderate persistent asthma, uncomplicated: Secondary | ICD-10-CM | POA: Diagnosis not present

## 2019-04-28 DIAGNOSIS — J4520 Mild intermittent asthma, uncomplicated: Secondary | ICD-10-CM | POA: Diagnosis not present

## 2019-04-28 DIAGNOSIS — J3089 Other allergic rhinitis: Secondary | ICD-10-CM

## 2019-04-28 NOTE — Assessment & Plan Note (Signed)
Well-controlled. Continue proper skin care measures. 

## 2019-04-28 NOTE — Assessment & Plan Note (Signed)
Past history - Respiratory symptoms for unknown years but worse the last year which seems to flare with her allergies and exertion.   Interim history - doing much better but still wheezing when outdoors every other day.  Keep track of your wheezing and how often it is happening.   Today's spirometry was normal.   Daily controller medication(s):Dulera 100 2 puffs twice a day with spacer and rinse mouth afterwards. Spacer given.   Prior to physical activity:May use albuterol rescue inhaler 2 puffs 5 to 15 minutes prior to strenuous physical activities.  Rescue medications:May use albuterol rescue inhaler 2 puffs or nebulizer every 4 to 6 hours as needed for shortness of breath, chest tightness, coughing, and wheezing. Monitor frequency of use.

## 2019-04-28 NOTE — Assessment & Plan Note (Signed)
Past history - Perennial rhinoconjunctivitis symptoms for the past unknown years but worse the last year.  Has tried Zyrtec and Flonase with minimal benefit.  2019 Immunocap was positive to mold and dust mites. 2019 skin testing was positive to molds and cockroaches.   Interim history - stable.   Continue environmental control measures.    Continue Singulair 10mg  daily.  May use over the counter antihistamines such as Zyrtec (cetirizine), Claritin (loratadine), Allegra (fexofenadine), or Xyzal (levocetirizine) daily as needed.  Continue Xhance 2 sprays 1-2 times a day.  May use eye drop 1 drop twice a day as needed for itch/watery eyes.   Had a detailed discussion with patient/family that clinical history is suggestive of allergic rhinitis, and may benefit from allergy immunotherapy (AIT). Discussed in detail regarding the dosing, schedule, side effects (mild to moderate local allergic reaction and rarely systemic allergic reactions including anaphylaxis), and benefits (significant improvement in nasal symptoms, seasonal flares of asthma) of immunotherapy with the patient. There is significant time commitment involved with allergy shots, which includes weekly immunotherapy injections for first 9-12 months and then biweekly to monthly injections for 3-5 years.   Information pamphlet given on allergy injections.

## 2019-04-28 NOTE — Patient Instructions (Addendum)
Other allergic rhinitis Past history -  past skin testing was positive to mold, cockroaches, dust mites.   Continue environmental control measures.    Continue singulair 10mg  daily.  May use over the counter antihistamines such as Zyrtec (cetirizine), Claritin (loratadine), Allegra (fexofenadine), or Xyzal (levocetirizine) daily as needed.  Continue Xhance 2 sprays 1-2 times a day.  May use eye drop 1 drop twice a day as needed for itch/watery eyes.   Asthma   Keep track of your wheezing and how often it is happening.   Today's spirometry was normal.   Daily controller medication(s):Dulera 100 2 puffs twice a day with spacer and rinse mouth afterwards. Spacer given.   Prior to physical activity:May use albuterol rescue inhaler 2 puffs 5 to 15 minutes prior to strenuous physical activities.  Rescue medications:May use albuterol rescue inhaler 2 puffs or nebulizer every 4 to 6 hours as needed for shortness of breath, chest tightness, coughing, and wheezing. Monitor frequency of use.  Asthma control goals:  Full participation in all desired activities (may need albuterol before activity) Albuterol use two times or less a week on average (not counting use with activity) Cough interfering with sleep two times or less a month Oral steroids no more than once a year No hospitalizations  Other atopic dermatitis  Continue proper skin care measures.  Follow up in 3 months.  Control of House Dust Mite Allergen . Dust mite allergens are a common trigger of allergy and asthma symptoms. While they can be found throughout the house, these microscopic creatures thrive in warm, humid environments such as bedding, upholstered furniture and carpeting. . Because so much time is spent in the bedroom, it is essential to reduce mite levels there.  . Encase pillows, mattresses, and box springs in special allergen-proof fabric covers or airtight, zippered plastic covers.  . Bedding should be  washed weekly in hot water (130 F) and dried in a hot dryer. Allergen-proof covers are available for comforters and pillows that can't be regularly washed.  Wendee Copp the allergy-proof covers every few months. Minimize clutter in the bedroom. Keep pets out of the bedroom.  Marland Kitchen Keep humidity less than 50% by using a dehumidifier or air conditioning. You can buy a humidity measuring device called a hygrometer to monitor this.  . If possible, replace carpets with hardwood, linoleum, or washable area rugs. If that's not possible, vacuum frequently with a vacuum that has a HEPA filter. . Remove all upholstered furniture and non-washable window drapes from the bedroom. . Remove all non-washable stuffed toys from the bedroom.  Wash stuffed toys weekly. Cockroach Allergen Avoidance Cockroaches are often found in the homes of densely populated urban areas, schools or commercial buildings, but these creatures can lurk almost anywhere. This does not mean that you have a dirty house or living area. . Block all areas where roaches can enter the home. This includes crevices, wall cracks and windows.  . Cockroaches need water to survive, so fix and seal all leaky faucets and pipes. Have an exterminator go through the house when your family and pets are gone to eliminate any remaining roaches. Marland Kitchen Keep food in lidded containers and put pet food dishes away after your pets are done eating. Vacuum and sweep the floor after meals, and take out garbage and recyclables. Use lidded garbage containers in the kitchen. Wash dishes immediately after use and clean under stoves, refrigerators or toasters where crumbs can accumulate. Wipe off the stove and other kitchen surfaces and  cupboards regularly. Mold Control . Mold and fungi can grow on a variety of surfaces provided certain temperature and moisture conditions exist.  . Outdoor molds grow on plants, decaying vegetation and soil. The major outdoor mold, Alternaria and  Cladosporium, are found in very high numbers during hot and dry conditions. Generally, a late summer - fall peak is seen for common outdoor fungal spores. Rain will temporarily lower outdoor mold spore count, but counts rise rapidly when the rainy period ends. . The most important indoor molds are Aspergillus and Penicillium. Dark, humid and poorly ventilated basements are ideal sites for mold growth. The next most common sites of mold growth are the bathroom and the kitchen. Outdoor (Seasonal) Mold Control . Use air conditioning and keep windows closed. . Avoid exposure to decaying vegetation. Marland Kitchen. Avoid leaf raking. . Avoid grain handling. . Consider wearing a face mask if working in moldy areas.  Indoor (Perennial) Mold Control  . Maintain humidity below 50%. . Get rid of mold growth on hard surfaces with water, detergent and, if necessary, 5% bleach (do not mix with other cleaners). Then dry the area completely. If mold covers an area more than 10 square feet, consider hiring an indoor environmental professional. . For clothing, washing with soap and water is best. If moldy items cannot be cleaned and dried, throw them away. . Remove sources e.g. contaminated carpets. . Repair and seal leaking roofs or pipes. Using dehumidifiers in damp basements may be helpful, but empty the water and clean units regularly to prevent mildew from forming. All rooms, especially basements, bathrooms and kitchens, require ventilation and cleaning to deter mold and mildew growth. Avoid carpeting on concrete or damp floors, and storing items in damp areas.

## 2019-04-28 NOTE — Progress Notes (Signed)
Follow Up Note  RE: Mckenzie [name] MRN: [id number] DOB: 1986-07-10 Date of Office Visit: 04/28/2019  Referring provider: No ref. provider found Primary care provider: System, Pcp Not In  Chief Complaint: Follow-up  History of Present Illness: I had the pleasure of seeing Mckenzie [name] for a follow up visit at the [facility] on 04/28/2019. She is a 33 y.o. female, who is being followed for allergic rhino conjunctivitis, asthma, atopic dermatitis. Today she is here for regular follow up visit. Her previous allergy office visit was on 10/14/2018 with Dr. Maudie Mercury.   Other allergic rhinitis Currently on Singulair 10mg  daily, zyrtec daily, Xhance 2 sprays 1-2 times a day, opcon A as needed with good benefit. No nosebleeds.   Asthma: Currently on Dulera 100 2 puffs BID and has wheezing about every other day after being outdoors. Using albuterol during these times with good benefit.   Denies any SOB, coughing, chest tightness, nocturnal awakenings, ER/urgent care visits or prednisone use since the last visit.  Other atopic dermatitis Well-controlled.   Assessment and Plan: Mckenzie [name] is a 33 y.o. female with: Moderate persistent asthma without complication Past history - Respiratory symptoms for unknown years but worse the last year which seems to flare with her allergies and exertion.   Interim history - doing much better but still wheezing when outdoors every other day.  Keep track of your wheezing and how often it is happening.   Today's spirometry was normal.   Daily controller medication(s):Dulera 100 2 puffs twice a day with spacer and rinse mouth afterwards. Spacer given.   Prior to physical activity:May use albuterol rescue inhaler 2 puffs 5 to 15 minutes prior to strenuous physical activities.  Rescue medications:May use albuterol rescue inhaler 2 puffs or nebulizer every 4 to 6 hours as needed for shortness of breath, chest tightness, coughing, and  wheezing. Monitor frequency of use.   Other allergic rhinitis Past history - Perennial rhinoconjunctivitis symptoms for the past unknown years but worse the last year.  Has tried Zyrtec and Flonase with minimal benefit.  2019 Immunocap was positive to mold and dust mites. 2019 skin testing was positive to molds and cockroaches.   Interim history - stable.   Continue environmental control measures.    Continue Singulair 10mg  daily.  May use over the counter antihistamines such as Zyrtec (cetirizine), Claritin (loratadine), Allegra (fexofenadine), or Xyzal (levocetirizine) daily as needed.  Continue Xhance 2 sprays 1-2 times a day.  May use eye drop 1 drop twice a day as needed for itch/watery eyes.   Had a detailed discussion with patient/family that clinical history is suggestive of allergic rhinitis, and may benefit from allergy immunotherapy (AIT). Discussed in detail regarding the dosing, schedule, side effects (mild to moderate local allergic reaction and rarely systemic allergic reactions including anaphylaxis), and benefits (significant improvement in nasal symptoms, seasonal flares of asthma) of immunotherapy with the patient. There is significant time commitment involved with allergy shots, which includes weekly immunotherapy injections for first 9-12 months and then biweekly to monthly injections for 3-5 years.   Information pamphlet given on allergy injections.   Other atopic dermatitis Well-controlled.   Continue proper skin care measures.  Return in about 3 months (around 07/29/2019).  Diagnostics: Spirometry:  Tracings reviewed. Her effort: Good reproducible efforts. FVC: 3.95L FEV1: 3.27L, 114% predicted FEV1/FVC ratio: 83% Interpretation: Spirometry consistent with normal pattern.  Please see scanned spirometry results for details.  Medication List:  Current Outpatient Medications  Medication Sig  Dispense Refill  . albuterol (PROVENTIL HFA;VENTOLIN HFA) 108 (90  Base) MCG/ACT inhaler Inhale 2 puffs into the lungs every 6 (six) hours as needed for wheezing or shortness of breath. 1 Inhaler 0  . cetirizine (ZYRTEC) 10 MG tablet Take 1 tablet (10 mg total) by mouth daily. 30 tablet 3  . Fluticasone Propionate (XHANCE) 93 MCG/ACT EXHU Place 2 sprays into the nose 2 (two) times daily. 16 mL 5  . mometasone-formoterol (DULERA) 100-5 MCG/ACT AERO Inhale 2 puffs into the lungs 2 (two) times daily. 1 Inhaler 5  . montelukast (SINGULAIR) 10 MG tablet Take 1 tablet (10 mg total) by mouth at bedtime. 30 tablet 5  . Naphazoline-Pheniramine (OPCON-A) 0.027-0.315 % SOLN Apply 2 drops to eye daily.     No current facility-administered medications for this visit.    Allergies: No Known Allergies I reviewed her past medical history, social history, family history, and environmental history and no significant changes have been reported from previous visit on 10/14/2018.  Review of Systems  Constitutional: Negative for appetite change, chills, fever and unexpected weight change.  HENT: Negative for congestion and rhinorrhea.   Eyes: Negative for itching.  Respiratory: Positive for wheezing. Negative for cough, chest tightness and shortness of breath.   Cardiovascular: Negative for chest pain.  Gastrointestinal: Negative for abdominal pain.  Genitourinary: Negative for difficulty urinating.  Skin: Negative for rash.  Allergic/Immunologic: Positive for environmental allergies. Negative for food allergies.  Neurological: Negative for headaches.   Objective: BP 130/90   Pulse 70   Temp 98.2 F (36.8 C) (Temporal)   Resp 18   SpO2 99%  There is no height or weight on file to calculate BMI. Physical Exam  Constitutional: She is oriented to person, place, and time. She appears well-developed and well-nourished.  HENT:  Head: Normocephalic and atraumatic.  Right Ear: External ear normal.  Left Ear: External ear normal.  Mouth/Throat: Oropharynx is clear and moist.   Eyes: Conjunctivae and EOM are normal.  Neck: Neck supple.  Cardiovascular: Normal rate, regular rhythm and normal heart sounds. Exam reveals no gallop and no friction rub.  No murmur heard. Pulmonary/Chest: Effort normal and breath sounds normal. She has no wheezes. She has no rales.  Abdominal: Soft.  Neurological: She is alert and oriented to person, place, and time.  Skin: Skin is warm. No rash noted.  Psychiatric: She has a normal mood and affect. Her behavior is normal.  Nursing note and vitals reviewed.  Previous notes and tests were reviewed. The plan was reviewed with the patient/family, and all questions/concerned were addressed.  It was my pleasure to see Mckenzie [name] today and participate in her care. Please feel free to contact me with any questions or concerns.  Sincerely,  Wyline MoodYoon Francesco Provencal, DO Allergy & Immunology  [facility] [place] office: [phone] [facility] office: [phone] [place]: [phone]

## 2019-05-04 ENCOUNTER — Encounter (INDEPENDENT_AMBULATORY_CARE_PROVIDER_SITE_OTHER): Payer: Self-pay | Admitting: Primary Care

## 2019-05-04 ENCOUNTER — Other Ambulatory Visit (HOSPITAL_COMMUNITY)
Admission: RE | Admit: 2019-05-04 | Discharge: 2019-05-04 | Disposition: A | Discharge status: Home / Self Care | Payer: Medicaid Other | Source: Ambulatory Visit | Attending: Primary Care | Admitting: Primary Care

## 2019-05-04 ENCOUNTER — Other Ambulatory Visit: Payer: Self-pay

## 2019-05-04 ENCOUNTER — Ambulatory Visit (INDEPENDENT_AMBULATORY_CARE_PROVIDER_SITE_OTHER): Payer: Medicaid Other | Admitting: Primary Care

## 2019-05-04 VITALS — BP 111/74 | HR 63 | Temp 98.0°F | Ht 66.0 in | Wt 266.6 lb

## 2019-05-04 DIAGNOSIS — Z124 Encounter for screening for malignant neoplasm of cervix: Secondary | ICD-10-CM

## 2019-05-04 DIAGNOSIS — Z01419 Encounter for gynecological examination (general) (routine) without abnormal findings: Secondary | ICD-10-CM | POA: Diagnosis not present

## 2019-05-04 DIAGNOSIS — Z833 Family history of diabetes mellitus: Secondary | ICD-10-CM | POA: Diagnosis not present

## 2019-05-04 DIAGNOSIS — Z760 Encounter for issue of repeat prescription: Secondary | ICD-10-CM

## 2019-05-04 DIAGNOSIS — Z113 Encounter for screening for infections with a predominantly sexual mode of transmission: Secondary | ICD-10-CM | POA: Insufficient documentation

## 2019-05-04 DIAGNOSIS — R7303 Prediabetes: Secondary | ICD-10-CM | POA: Diagnosis not present

## 2019-05-04 DIAGNOSIS — J4540 Moderate persistent asthma, uncomplicated: Secondary | ICD-10-CM

## 2019-05-04 DIAGNOSIS — Z114 Encounter for screening for human immunodeficiency virus [HIV]: Secondary | ICD-10-CM

## 2019-05-04 DIAGNOSIS — Z1239 Encounter for other screening for malignant neoplasm of breast: Secondary | ICD-10-CM | POA: Diagnosis not present

## 2019-05-04 DIAGNOSIS — Z6835 Body mass index (BMI) 35.0-35.9, adult: Secondary | ICD-10-CM

## 2019-05-04 LAB — POCT GLYCOSYLATED HEMOGLOBIN (HGB A1C): Hemoglobin A1C: 5.7 % — AB (ref 4.0–5.6)

## 2019-05-04 MED ORDER — ALBUTEROL SULFATE HFA 108 (90 BASE) MCG/ACT IN AERS: 2.0000 | INHALATION_SPRAY | Freq: Four times a day (QID) | RESPIRATORY_TRACT | 1 refills | Status: DC | PRN

## 2019-05-04 MED ORDER — DULERA 100-5 MCG/ACT IN AERO: 2.0000 | INHALATION_SPRAY | Freq: Two times a day (BID) | RESPIRATORY_TRACT | 3 refills | Status: DC

## 2019-05-04 MED ORDER — CETIRIZINE HCL 10 MG PO TABS: 10.0000 mg | ORAL_TABLET | Freq: Every day | ORAL | 3 refills | Status: DC

## 2019-05-04 MED ORDER — MONTELUKAST SODIUM 10 MG PO TABS: 10.0000 mg | ORAL_TABLET | Freq: Every day | ORAL | 5 refills | Status: DC

## 2019-05-04 NOTE — Patient Instructions (Addendum)
 Calorie Counting for Weight Loss Calories are units of energy. Your body needs a certain amount of calories from food to keep you going throughout the day. When you eat more calories than your body needs, your body stores the extra calories as fat. When you eat fewer calories than your body needs, your body burns fat to get the energy it needs. Calorie counting means keeping track of how many calories you eat and drink each day. Calorie counting can be helpful if you need to lose weight. If you make sure to eat fewer calories than your body needs, you should lose weight. Ask your health care provider what a healthy weight is for you. For calorie counting to work, you will need to eat the right number of calories in a day in order to lose a healthy amount of weight per week. A dietitian can help you determine how many calories you need in a day and will give you suggestions on how to reach your calorie goal.  A healthy amount of weight to lose per week is usually 1-2 lb (0.5-0.9 kg). This usually means that your daily calorie intake should be reduced by 500-750 calories.  Eating 1,200 - 1,500 calories per day can help most women lose weight.  Eating 1,500 - 1,800 calories per day can help most men lose weight. What is my plan? My goal is to have __________ calories per day. If I have this many calories per day, I should lose around __________ pounds per week. What do I need to know about calorie counting? In order to meet your daily calorie goal, you will need to:  Find out how many calories are in each food you would like to eat. Try to do this before you eat.  Decide how much of the food you plan to eat.  Write down what you ate and how many calories it had. Doing this is called keeping a food log. To successfully lose weight, it is important to balance calorie counting with a healthy lifestyle that includes regular activity. Aim for 150 minutes of moderate exercise (such as walking) or 75  minutes of vigorous exercise (such as running) each week. Where do I find calorie information?  The number of calories in a food can be found on a Nutrition Facts label. If a food does not have a Nutrition Facts label, try to look up the calories online or ask your dietitian for help. Remember that calories are listed per serving. If you choose to have more than one serving of a food, you will have to multiply the calories per serving by the amount of servings you plan to eat. For example, the label on a package of bread might say that a serving size is 1 slice and that there are 90 calories in a serving. If you eat 1 slice, you will have eaten 90 calories. If you eat 2 slices, you will have eaten 180 calories. How do I keep a food log? Immediately after each meal, record the following information in your food log:  What you ate. Don't forget to include toppings, sauces, and other extras on the food.  How much you ate. This can be measured in cups, ounces, or number of items.  How many calories each food and drink had.  The total number of calories in the meal. Keep your food log near you, such as in a small notebook in your pocket, or use a mobile app or website. Some programs will   calculate calories for you and show you how many calories you have left for the day to meet your goal. What are some calorie counting tips?   Use your calories on foods and drinks that will fill you up and not leave you hungry: ? Some examples of foods that fill you up are nuts and nut butters, vegetables, lean proteins, and high-fiber foods like whole grains. High-fiber foods are foods with more than 5 g fiber per serving. ? Drinks such as sodas, specialty coffee drinks, alcohol, and juices have a lot of calories, yet do not fill you up.  Eat nutritious foods and avoid empty calories. Empty calories are calories you get from foods or beverages that do not have many vitamins or protein, such as candy, sweets, and  soda. It is better to have a nutritious high-calorie food (such as an avocado) than a food with few nutrients (such as a bag of chips).  Know how many calories are in the foods you eat most often. This will help you calculate calorie counts faster.  Pay attention to calories in drinks. Low-calorie drinks include water and unsweetened drinks.  Pay attention to nutrition labels for "low fat" or "fat free" foods. These foods sometimes have the same amount of calories or more calories than the full fat versions. They also often have added sugar, starch, or salt, to make up for flavor that was removed with the fat.  Find a way of tracking calories that works for you. Get creative. Try different apps or programs if writing down calories does not work for you. What are some portion control tips?  Know how many calories are in a serving. This will help you know how many servings of a certain food you can have.  Use a measuring cup to measure serving sizes. You could also try weighing out portions on a kitchen scale. With time, you will be able to estimate serving sizes for some foods.  Take some time to put servings of different foods on your favorite plates, bowls, and cups so you know what a serving looks like.  Try not to eat straight from a bag or box. Doing this can lead to overeating. Put the amount you would like to eat in a cup or on a plate to make sure you are eating the right portion.  Use smaller plates, glasses, and bowls to prevent overeating.  Try not to multitask (for example, watch TV or use your computer) while eating. If it is time to eat, sit down at a table and enjoy your food. This will help you to know when you are full. It will also help you to be aware of what you are eating and how much you are eating. What are tips for following this plan? Reading food labels  Check the calorie count compared to the serving size. The serving size may be smaller than what you are used to  eating.  Check the source of the calories. Make sure the food you are eating is high in vitamins and protein and low in saturated and trans fats. Shopping  Read nutrition labels while you shop. This will help you make healthy decisions before you decide to purchase your food.  Make a grocery list and stick to it. Cooking  Try to cook your favorite foods in a healthier way. For example, try baking instead of frying.  Use low-fat dairy products. Meal planning  Use more fruits and vegetables. Half of your plate should be  fruits and vegetables.  Include lean proteins like poultry and fish. How do I count calories when eating out?  Ask for smaller portion sizes.  Consider sharing an entree and sides instead of getting your own entree.  If you get your own entree, eat only half. Ask for a box at the beginning of your meal and put the rest of your entree in it so you are not tempted to eat it.  If calories are listed on the menu, choose the lower calorie options.  Choose dishes that include vegetables, fruits, whole grains, low-fat dairy products, and lean protein.  Choose items that are boiled, broiled, grilled, or steamed. Stay away from items that are buttered, battered, fried, or served with cream sauce. Items labeled "crispy" are usually fried, unless stated otherwise.  Choose water, low-fat milk, unsweetened iced tea, or other drinks without added sugar. If you want an alcoholic beverage, choose a lower calorie option such as a glass of wine or light beer.  Ask for dressings, sauces, and syrups on the side. These are usually high in calories, so you should limit the amount you eat.  If you want a salad, choose a garden salad and ask for grilled meats. Avoid extra toppings like bacon, cheese, or fried items. Ask for the dressing on the side, or ask for olive oil and vinegar or lemon to use as dressing.  Estimate how many servings of a food you are given. For example, a serving of  cooked rice is  cup or about the size of half a baseball. Knowing serving sizes will help you be aware of how much food you are eating at restaurants. The list below tells you how big or small some common portion sizes are based on everyday objects: ? 1 oz-4 stacked dice. ? 3 oz-1 deck of cards. ? 1 tsp-1 die. ? 1 Tbsp- a ping-pong ball. ? 2 Tbsp-1 ping-pong ball. ?  cup- baseball. ? 1 cup-1 baseball. Summary  Calorie counting means keeping track of how many calories you eat and drink each day. If you eat fewer calories than your body needs, you should lose weight.  A healthy amount of weight to lose per week is usually 1-2 lb (0.5-0.9 kg). This usually means reducing your daily calorie intake by 500-750 calories.  The number of calories in a food can be found on a Nutrition Facts label. If a food does not have a Nutrition Facts label, try to look up the calories online or ask your dietitian for help.  Use your calories on foods and drinks that will fill you up, and not on foods and drinks that will leave you hungry.  Use smaller plates, glasses, and bowls to prevent overeating. This information is not intended to replace advice given to you by your health care provider. Make sure you discuss any questions you have with your health care provider. Document Released: 09/23/2005 Document Revised: 06/12/2018 Document Reviewed: 08/23/2016 Elsevier Patient Education  2020 [employer]  Prediabetes Eating Plan Prediabetes is a condition that causes blood sugar (glucose) levels to be higher than normal. This increases the risk for developing diabetes. In order to prevent diabetes from developing, your health care provider may recommend a diet and other lifestyle changes to help you:  Control your blood glucose levels.  Improve your cholesterol levels.  Manage your blood pressure. Your health care provider may recommend working with a diet and nutrition specialist (dietitian) to make a  meal plan that is best for  you. What are tips for following this plan? Lifestyle  Set weight loss goals with the help of your health care team. It is recommended that most people with prediabetes lose 7% of their current body weight.  Exercise for at least 30 minutes at least 5 days a week.  Attend a support group or seek ongoing support from a mental health counselor.  Take over-the-counter and prescription medicines only as told by your health care provider. Reading food labels  Read food labels to check the amount of fat, salt (sodium), and sugar in prepackaged foods. Avoid foods that have: ? Saturated fats. ? Trans fats. ? Added sugars.  Avoid foods that have more than 300 milligrams (mg) of sodium per serving. Limit your daily sodium intake to less than 2,300 mg each day. Shopping  Avoid buying pre-made and processed foods. Cooking  Cook with olive oil. Do not use butter, lard, or ghee.  Bake, broil, grill, or boil foods. Avoid frying. Meal planning   Work with your dietitian to develop an eating plan that is right for you. This may include: ? Tracking how many calories you take in. Use a food diary, notebook, or mobile application to track what you eat at each meal. ? Using the glycemic index (GI) to plan your meals. The index tells you how quickly a food will raise your blood glucose. Choose low-GI foods. These foods take a longer time to raise blood glucose.  Consider following a Mediterranean diet. This diet includes: ? Several servings each day of fresh fruits and vegetables. ? Eating fish at least twice a week. ? Several servings each day of whole grains, beans, nuts, and seeds. ? Using olive oil instead of other fats. ? Moderate alcohol consumption. ? Eating small amounts of red meat and whole-fat dairy.  If you have high blood pressure, you may need to limit your sodium intake or follow a diet such as the DASH eating plan. DASH is an eating plan that aims to  lower high blood pressure. What foods are recommended? The items listed below may not be a complete list. Talk with your dietitian about what dietary choices are best for you. Grains Whole grains, such as whole-wheat or whole-grain breads, crackers, cereals, and pasta. Unsweetened oatmeal. Bulgur. Barley. Quinoa. Brown rice. Corn or whole-wheat flour tortillas or taco shells. Vegetables Lettuce. Spinach. Peas. Beets. Cauliflower. Cabbage. Broccoli. Carrots. Tomatoes. Squash. Eggplant. Herbs. Peppers. Onions. Cucumbers. Brussels sprouts. Fruits Berries. Bananas. Apples. Oranges. Grapes. Papaya. Mango. Pomegranate. Kiwi. Grapefruit. Cherries. Meats and other protein foods Seafood. Poultry without skin. Lean cuts of pork and beef. Tofu. Eggs. Nuts. Beans. Dairy Low-fat or fat-free dairy products, such as yogurt, cottage cheese, and cheese. Beverages Water. Tea. Coffee. Sugar-free or diet soda. Seltzer water. Lowfat or no-fat milk. Milk alternatives, such as soy or almond milk. Fats and oils Olive oil. Canola oil. Sunflower oil. Grapeseed oil. Avocado. Walnuts. Sweets and desserts Sugar-free or low-fat pudding. Sugar-free or low-fat ice cream and other frozen treats. Seasoning and other foods Herbs. Sodium-free spices. Mustard. Relish. Low-fat, low-sugar ketchup. Low-fat, low-sugar barbecue sauce. Low-fat or fat-free mayonnaise. What foods are not recommended? The items listed below may not be a complete list. Talk with your dietitian about what dietary choices are best for you. Grains Refined white flour and flour products, such as bread, pasta, snack foods, and cereals. Vegetables Canned vegetables. Frozen vegetables with butter or cream sauce. Fruits Fruits canned with syrup. Meats and other protein foods Fatty cuts of meat.  Poultry with skin. Breaded or fried meat. Processed meats. Dairy Full-fat yogurt, cheese, or milk. Beverages Sweetened drinks, such as sweet iced tea and soda.  Fats and oils Butter. Lard. Ghee. Sweets and desserts Baked goods, such as cake, cupcakes, pastries, cookies, and cheesecake. Seasoning and other foods Spice mixes with added salt. Ketchup. Barbecue sauce. Mayonnaise. Summary  To prevent diabetes from developing, you may need to make diet and other lifestyle changes to help control blood sugar, improve cholesterol levels, and manage your blood pressure.  Set weight loss goals with the help of your health care team. It is recommended that most people with prediabetes lose 7 percent of their current body weight.  Consider following a Mediterranean diet that includes plenty of fresh fruits and vegetables, whole grains, beans, nuts, seeds, fish, lean meat, low-fat dairy, and healthy oils. This information is not intended to replace advice given to you by your health care provider. Make sure you discuss any questions you have with your health care provider. Document Released: 02/07/2015 Document Revised: 01/15/2019 Document Reviewed: 11/27/2016 Elsevier Patient Education  2020 [employer].

## 2019-05-04 NOTE — Progress Notes (Signed)
Established Patient Office Visit  Subjective:  Patient ID: Mckenzie [name], female    DOB: 12-18-1985  Age: 33 y.o. MRN: [id number]  CC:  Chief Complaint  Patient presents with  . Gynecologic Exam    HPI Mckenzie [name] presents for pap she received a letter from [facility] stating she needed to have one done. Explained normally unless a problem pap are recommended every 3 years.  Past Medical History:  Diagnosis Date  . Asthma   . Eczema     History reviewed. No pertinent surgical history.  History reviewed. No pertinent family history.  Social History   Socioeconomic History  . Marital status: Single    Spouse name: Not on file  . Number of children: Not on file  . Years of education: Not on file  . Highest education level: Not on file  Occupational History  . Not on file  Social Needs  . Financial resource strain: Not on file  . Food insecurity    Worry: Not on file    Inability: Not on file  . Transportation needs    Medical: Not on file    Non-medical: Not on file  Tobacco Use  . Smoking status: Never Smoker  . Smokeless tobacco: Never Used  Substance and Sexual Activity  . Alcohol use: Never    Frequency: Never  . Drug use: Never  . Sexual activity: Never  Lifestyle  . Physical activity    Days per week: Not on file    Minutes per session: Not on file  . Stress: Not on file  Relationships  . Social Herbalist on phone: Not on file    Gets together: Not on file    Attends religious service: Not on file    Active member of club or organization: Not on file    Attends meetings of clubs or organizations: Not on file    Relationship status: Not on file  . Intimate partner violence    Fear of current or ex partner: Not on file    Emotionally abused: Not on file    Physically abused: Not on file    Forced sexual activity: Not on file  Other Topics Concern  . Not on file  Social History Narrative  . Not on file    Outpatient  Medications Prior to Visit  Medication Sig Dispense Refill  . Fluticasone Propionate (XHANCE) 93 MCG/ACT EXHU Place 2 sprays into the nose 2 (two) times daily. 16 mL 5  . Naphazoline-Pheniramine (OPCON-A) 0.027-0.315 % SOLN Apply 2 drops to eye daily.    Marland Kitchen albuterol (PROVENTIL HFA;VENTOLIN HFA) 108 (90 Base) MCG/ACT inhaler Inhale 2 puffs into the lungs every 6 (six) hours as needed for wheezing or shortness of breath. 1 Inhaler 0  . cetirizine (ZYRTEC) 10 MG tablet Take 1 tablet (10 mg total) by mouth daily. 30 tablet 3  . mometasone-formoterol (DULERA) 100-5 MCG/ACT AERO Inhale 2 puffs into the lungs 2 (two) times daily. 1 Inhaler 5  . montelukast (SINGULAIR) 10 MG tablet Take 1 tablet (10 mg total) by mouth at bedtime. 30 tablet 5   No facility-administered medications prior to visit.     No Known Allergies  ROS Review of Systems  All other systems reviewed and are negative.     Objective:     CONSTITUTIONAL: Well-developed, well-nourished female in no acute distress.  HENT:  Normocephalic, atraumatic, External right and left ear normal. Oropharynx is clear and moist EYES: Conjunctivae and EOM  are normal. Pupils are equal, round, and reactive to light. No scleral icterus.  NECK: Normal range of motion, supple, no masses.  Normal thyroid.  SKIN: Skin is warm and dry. No rash noted. Not diaphoretic. No erythema. No pallor. Mckenzie [name]: Alert and oriented to person, place, and time. Normal reflexes, muscle tone coordination. No cranial nerve deficit noted. PSYCHIATRIC: Normal mood and affect. Normal behavior. Normal judgment and thought content. CARDIOVASCULAR: Normal heart rate noted, regular rhythm RESPIRATORY: Clear to auscultation bilaterally. Effort and breath sounds normal, no problems with respiration noted. BREASTS: Symmetric in size. No masses, skin changes, nipple drainage, or lymphadenopathy. ABDOMEN: Soft, normal bowel sounds, no distention noted.  No tenderness, rebound or  guarding.  PELVIC: Normal appearing external genitalia; normal appearing vaginal mucosa and cervix.  No abnormal discharge noted.  Pap smear obtained.  Normal uterine size, no other palpable masses, no uterine or adnexal tenderness. MUSCULOSKELETAL: Normal range of motion. No tenderness.  No cyanosis, clubbing, or edema.  2+ distal pulses.  Mckenzie [name] , Thank you for taking time to come for your Medicare Wellness Visit. I appreciate your ongoing commitment to your health goals. Please review the following plan we discussed and let me know if I can assist you in the future.   These are the goals we discussed: Goals   Weight loss Prediabetes A1C 5.7 maintain or decrease to less than A1C 5.6     This is a list of the screening recommended for you and due dates:  Health Maintenance  Topic Date Due  . Flu Shot  05/08/2019  . Pap Smear  04/27/2021  . Tetanus Vaccine  08/17/2027  . HIV Screening  Completed   BP 111/74 (BP Location: Right Arm, Patient Position: Sitting, Cuff Size: Large)   Pulse 63   Temp 98 F (36.7 C) (Oral)   Ht 5' 6"  (1.676 m)   Wt 266 lb 9.6 oz (120.9 kg)   SpO2 99%   BMI 43.03 kg/m  Wt Readings from Last 3 Encounters:  05/04/19 266 lb 9.6 oz (120.9 kg)  12/17/18 249 lb (112.9 kg)  09/16/18 254 lb 6.4 oz (115.4 kg)     There are no preventive care reminders to display for this patient.  There are no preventive care reminders to display for this patient.  Lab Results  Component Value Date   TSH 2.770 04/16/2017   Lab Results  Component Value Date   WBC 11.9 (H) 07/22/2018   HGB 13.5 07/22/2018   HCT 40.0 07/22/2018   MCV 95 07/22/2018   PLT 363 07/22/2018   Lab Results  Component Value Date   NA 141 04/16/2017   K 4.2 04/16/2017   CO2 26 04/16/2017   GLUCOSE 88 04/16/2017   BUN 9 04/16/2017   CREATININE 0.83 04/16/2017   BILITOT 0.3 04/16/2017   ALKPHOS 49 04/16/2017   AST 12 04/16/2017   ALT 12 04/16/2017   PROT 7.1 04/16/2017    ALBUMIN 4.2 04/16/2017   CALCIUM 9.4 04/16/2017   Lab Results  Component Value Date   CHOL 167 04/16/2017   Lab Results  Component Value Date   HDL 46 04/16/2017   Lab Results  Component Value Date   LDLCALC 107 (H) 04/16/2017   Lab Results  Component Value Date   TRIG 71 04/16/2017   Lab Results  Component Value Date   CHOLHDL 3.6 04/16/2017   Lab Results  Component Value Date   HGBA1C 5.7 (A) 05/04/2019  Assessment & Plan:   Jolane was seen today for gynecologic exam.  Diagnoses and all orders for this visit:  Body mass index (BMI) of 35.0-35.9 in adult Obesity is 30-39 indicating an excess in caloric intake or underlining conditions. This may lead to other co-morbidities. Lifestyle modifications of diet and exercise may reduce obesity.  -     Lipid panel -     CBC with Differential -     TSH + free T4  Screening for cervical cancer -     Cytology - PAP([facility])  Encounter for screening breast examination and discussion of breast self examination  Moderate persistent asthma without complication  Family history of diabetes mellitus/Prediabetes A new diagnosis .me -     CBC with Differential -     CMP14+EGFR -     HgB A1c 5.7   Screening for HIV (human immunodeficiency virus) -     HIV antibody (with reflex)  Screening for STD (sexually transmitted disease) -     Cervicovaginal ancillary only  Other orders/ Medication refills -     albuterol (VENTOLIN HFA) 108 (90 Base) MCG/ACT inhaler; Inhale 2 puffs into the lungs every 6 (six) hours as needed for wheezing or shortness of breath. -     mometasone-formoterol (DULERA) 100-5 MCG/ACT AERO; Inhale 2 puffs into the lungs 2 (two) times daily. -     montelukast (SINGULAIR) 10 MG tablet; Take 1 tablet (10 mg total) by mouth at bedtime. -     cetirizine (ZYRTEC) 10 MG tablet; Take 1 tablet (10 mg total) by mouth daily.    No orders of the defined types were placed in this encounter.   Follow-up:  Return in 3 months (on 08/04/2019) for weight maanagement .    Kerin Perna, NP

## 2019-05-05 ENCOUNTER — Encounter (INDEPENDENT_AMBULATORY_CARE_PROVIDER_SITE_OTHER): Payer: Self-pay | Admitting: Primary Care

## 2019-05-05 ENCOUNTER — Encounter: Payer: Self-pay | Admitting: General Practice

## 2019-05-05 LAB — CBC WITH DIFFERENTIAL/PLATELET
Basophils Absolute: 0 10*3/uL (ref 0.0–0.2)
Basos: 0 %
EOS (ABSOLUTE): 0.1 10*3/uL (ref 0.0–0.4)
Eos: 1 %
Hematocrit: 40.2 % (ref 34.0–46.6)
Hemoglobin: 13 g/dL (ref 11.1–15.9)
Immature Grans (Abs): 0 10*3/uL (ref 0.0–0.1)
Immature Granulocytes: 0 %
Lymphocytes Absolute: 3 10*3/uL (ref 0.7–3.1)
Lymphs: 30 %
MCH: 31.3 pg (ref 26.6–33.0)
MCHC: 32.3 g/dL (ref 31.5–35.7)
MCV: 97 fL (ref 79–97)
Monocytes Absolute: 0.5 10*3/uL (ref 0.1–0.9)
Monocytes: 5 %
Neutrophils Absolute: 6.2 10*3/uL (ref 1.4–7.0)
Neutrophils: 64 %
Platelets: 373 10*3/uL (ref 150–450)
RBC: 4.15 x10E6/uL (ref 3.77–5.28)
RDW: 12.9 % (ref 11.7–15.4)
WBC: 9.8 10*3/uL (ref 3.4–10.8)

## 2019-05-05 LAB — LIPID PANEL
Chol/HDL Ratio: 2.6 ratio (ref 0.0–4.4)
Cholesterol, Total: 144 mg/dL (ref 100–199)
HDL: 55 mg/dL (ref >39)
LDL Calculated: 78 mg/dL (ref 0–99)
Triglycerides: 56 mg/dL (ref 0–149)
VLDL Cholesterol Cal: 11 mg/dL (ref 5–40)

## 2019-05-05 LAB — CMP14+EGFR
ALT: 25 IU/L (ref 0–32)
AST: 18 IU/L (ref 0–40)
Albumin/Globulin Ratio: 1.5 (ref 1.2–2.2)
Albumin: 3.8 g/dL (ref 3.8–4.8)
Alkaline Phosphatase: 52 IU/L (ref 39–117)
BUN/Creatinine Ratio: 11 (ref 9–23)
BUN: 8 mg/dL (ref 6–20)
Bilirubin Total: 0.3 mg/dL (ref 0.0–1.2)
CO2: 24 mmol/L (ref 20–29)
Calcium: 9.1 mg/dL (ref 8.7–10.2)
Chloride: 102 mmol/L (ref 96–106)
Creatinine, Ser: 0.72 mg/dL (ref 0.57–1.00)
GFR calc Af Amer: 126 mL/min/{1.73_m2} (ref >59)
GFR calc non Af Amer: 110 mL/min/{1.73_m2} (ref >59)
Globulin, Total: 2.6 g/dL (ref 1.5–4.5)
Glucose: 100 mg/dL — ABNORMAL HIGH (ref 65–99)
Potassium: 4 mmol/L (ref 3.5–5.2)
Sodium: 141 mmol/L (ref 134–144)
Total Protein: 6.4 g/dL (ref 6.0–8.5)

## 2019-05-05 LAB — TSH+FREE T4
Free T4: 1.09 ng/dL (ref 0.82–1.77)
TSH: 2.84 u[IU]/mL (ref 0.450–4.500)

## 2019-05-05 LAB — HIV ANTIBODY (ROUTINE TESTING W REFLEX): HIV Screen 4th Generation wRfx: NONREACTIVE

## 2019-05-06 LAB — CERVICOVAGINAL ANCILLARY ONLY
Bacterial vaginitis: POSITIVE — AB
Candida vaginitis: NEGATIVE
Chlamydia: NEGATIVE
Neisseria Gonorrhea: NEGATIVE
Trichomonas: NEGATIVE

## 2019-05-07 ENCOUNTER — Other Ambulatory Visit (INDEPENDENT_AMBULATORY_CARE_PROVIDER_SITE_OTHER): Payer: Self-pay | Admitting: Primary Care

## 2019-05-07 ENCOUNTER — Telehealth (INDEPENDENT_AMBULATORY_CARE_PROVIDER_SITE_OTHER): Payer: Self-pay

## 2019-05-07 LAB — CYTOLOGY - PAP: Diagnosis: NEGATIVE

## 2019-05-07 MED ORDER — METRONIDAZOLE 500 MG PO TABS: 500.0000 mg | ORAL_TABLET | Freq: Two times a day (BID) | ORAL | 0 refills | Status: AC

## 2019-05-07 NOTE — Telephone Encounter (Signed)
-----   Message from Kerin Perna, NP sent at 05/07/2019  8:40 AM EDT ----- A prescription for metronidazole. You take this medication twice a day for 7 days. Be sure that you do not drink alcohol when you take this medication because the combination can give you severe nausea and vomiting.  It can sometimes give people a metallic taste in their mouth while they are taking it. When you take antibiotics, it can wipe out your gut flora.  This can cause problems like diarrhea.  It would be helpful to restore your gut flora and potentially decrease the side effects if you were to take either an over-the-counter probiotic such as [employer], [employer], therapeutic or [employer]  I would recommend taking this on a daily basis for at least 3-4 weeks.

## 2019-05-07 NOTE — Telephone Encounter (Signed)
Patient is aware that STDs are negative. BV is positive, metronidazole sent to pharmacy to take twice a day for seven days and avoid alcohol use while taking antibiotic. All other labs normal. Will call when pap results are available. Nat Christen, CMA

## 2019-05-07 NOTE — Progress Notes (Unsigned)
fl

## 2019-05-12 ENCOUNTER — Telehealth: Payer: Self-pay

## 2019-05-12 ENCOUNTER — Ambulatory Visit (INDEPENDENT_AMBULATORY_CARE_PROVIDER_SITE_OTHER): Payer: Medicaid Other | Admitting: Medical

## 2019-05-12 ENCOUNTER — Encounter: Payer: Self-pay | Admitting: Medical

## 2019-05-12 ENCOUNTER — Other Ambulatory Visit: Payer: Self-pay

## 2019-05-12 VITALS — BP 112/74 | HR 65 | Ht 66.0 in | Wt 266.0 lb

## 2019-05-12 DIAGNOSIS — Z3009 Encounter for other general counseling and advice on contraception: Secondary | ICD-10-CM

## 2019-05-12 DIAGNOSIS — Z30432 Encounter for removal of intrauterine contraceptive device: Secondary | ICD-10-CM | POA: Diagnosis not present

## 2019-05-12 MED ORDER — NORGESTIMATE-ETH ESTRADIOL 0.25-35 MG-MCG PO TABS: 1.0000 | ORAL_TABLET | Freq: Every day | ORAL | 11 refills | Status: AC

## 2019-05-12 NOTE — Telephone Encounter (Signed)
Patient was called and a voicemail was left informing patient to return phone call for lab results. Patient has an active MyChart to view lab results.

## 2019-05-12 NOTE — Patient Instructions (Signed)

## 2019-05-12 NOTE — Progress Notes (Signed)
    GYNECOLOGY CLINIC PROCEDURE NOTE  Ms. Mckenzie [name] is a 33 y.o. [phone] here for Mirena IUD removal. No GYN concerns.  Last pap smear was on 05/04/19 and was normal.  IUD Removal  Patient was in the dorsal lithotomy position, normal external genitalia was noted.  A speculum was placed in the patient's vagina, normal discharge was noted, no lesions. The multiparous cervix was visualized, no lesions, no abnormal discharge.  The strings of the IUD were grasped and pulled using ring forceps. The IUD was removed in its entirety. Patient tolerated the procedure well.    Patient will use OCPs for contraception. Routine preventative health maintenance measures emphasized. Rx for Sprintec sent to patient's pharmacy of choice Advised Ibuprofen PRN today for cramping Patient to follow-up in 1 year for annual exam.   Luvenia Redden, PA-C 05/12/2019 1:20 PM

## 2019-05-12 NOTE — Telephone Encounter (Signed)
-----   Message from Carylon Perches, [place] sent at 05/10/2019 11:24 AM EDT ----- Left voicemail to call back regarding pap results.

## 2019-05-12 NOTE — Progress Notes (Signed)
Pt wants IUD out and wants to start OCP

## 2019-05-12 NOTE — Telephone Encounter (Signed)
Patient returned phone call and was informed of lab results. 

## 2019-05-13 ENCOUNTER — Encounter: Payer: Self-pay | Admitting: General Practice

## 2019-05-13 NOTE — Progress Notes (Signed)
Gomez Cleverly, [place] 05/12/19 2:02 PM Note Patient returned phone call and was informed of lab results.

## 2019-07-02 ENCOUNTER — Telehealth: Payer: Self-pay

## 2019-07-02 MED ORDER — XHANCE 93 MCG/ACT NA EXHU: 2.0000 | INHALANT_SUSPENSION | Freq: Two times a day (BID) | NASAL | 5 refills | Status: DC

## 2019-07-02 NOTE — Telephone Encounter (Signed)
Prior authorization requested for Xhance nasal spray. This has been approved by her insurance. I sent in a new prescription to [facility] as well. I am going to send the patient a my chart message letting her know this as well.

## 2019-08-02 ENCOUNTER — Ambulatory Visit (INDEPENDENT_AMBULATORY_CARE_PROVIDER_SITE_OTHER): Payer: Medicaid Other | Admitting: Allergy

## 2019-08-02 ENCOUNTER — Other Ambulatory Visit: Payer: Self-pay

## 2019-08-02 ENCOUNTER — Encounter: Payer: Self-pay | Admitting: Allergy

## 2019-08-02 VITALS — BP 118/70 | HR 62 | Temp 97.8°F | Resp 17 | Ht 66.0 in | Wt 273.6 lb

## 2019-08-02 DIAGNOSIS — H1013 Acute atopic conjunctivitis, bilateral: Secondary | ICD-10-CM | POA: Diagnosis not present

## 2019-08-02 DIAGNOSIS — J3089 Other allergic rhinitis: Secondary | ICD-10-CM

## 2019-08-02 DIAGNOSIS — J4540 Moderate persistent asthma, uncomplicated: Secondary | ICD-10-CM

## 2019-08-02 MED ORDER — DULERA 200-5 MCG/ACT IN AERO: 2.0000 | INHALATION_SPRAY | Freq: Two times a day (BID) | RESPIRATORY_TRACT | 5 refills | Status: DC

## 2019-08-02 MED ORDER — CETIRIZINE HCL 10 MG PO TABS: 10.0000 mg | ORAL_TABLET | Freq: Every day | ORAL | 5 refills | Status: DC

## 2019-08-02 MED ORDER — OLOPATADINE HCL 0.1 % OP SOLN: 1.0000 [drp] | Freq: Two times a day (BID) | OPHTHALMIC | 5 refills | Status: DC | PRN

## 2019-08-02 NOTE — Assessment & Plan Note (Signed)
   See assessment and plan as above for allergic rhinitis.  

## 2019-08-02 NOTE — Assessment & Plan Note (Signed)
Past history - Perennial rhinoconjunctivitis symptoms for the past unknown years but worse the last year. 2019 Immunocap was positive to mold and dust mites. 2019 skin testing was positive to molds and cockroaches.   Interim history - Symptoms slightly worse the last month. Declines starting allergy immunotherapy due to needle phobia.  Continueenvironmental control measures.  Continue Singulair 10mg  daily.  Take Zyrtec (cetirizine) 10mg  daily.  Continue Xhance 2 sprays per nostril 1-2 times a day.  May use Patanol 0.1% 1 drop per eye twice a day as needed for itch/watery eyes.

## 2019-08-02 NOTE — Progress Notes (Signed)
Follow Up Note  RE: Mckenzie [name] MRN: [id number] DOB: [birth year] Date of Office Visit: 08/02/2019  Referring provider: No ref. provider found Primary care provider: Kerin Perna, NP  Chief Complaint: Asthma  History of Present Illness: I had the pleasure of seeing Mckenzie [name] for a follow up visit at the [facility] on 08/02/2019. She is a 33 y.o. female, who is being followed for asthma, allergic rhinoconjunctivitis and atopic dermatitis. Today she is here for regular follow up visit.  Her previous allergy office visit was on 04/28/2019 with Dr. Maudie Mercury.   Moderate persistent asthma ACT score 19 About 4-5 weeks ago had asthma flare at work and questionable crackles in her lungs.  Denies any URI symptoms.   Currently on Dulera 100 2 puffs twice a day with spacer and rinsing mouth out afterwards. Using albuterol about 3 times a week due to exertional related SOB with good benefit.   Still wheezing about 1-2 times a week with no triggers noted. Uses albuterol with good benefit.   No ER/urgent care visits or prednisone use since the last visit.   Allergic rhino conjunctivitis Worsening symptoms the last few weeks. Wonders if that's what making her asthma worse lately.  Currently on Singulair daily, Xhance 2 sprays twice a day and no nosebleeds.  Using eye drops 3 times a week with some benefit.  Declines starting allergy injections as she has some needle phobia.   Assessment and Plan: Mckenzie [name] is a 32 y.o. female with: Moderate persistent asthma without complication Past history - Respiratory symptoms for unknown years but worse the last year which seems to flare with her allergies and exertion.   Interim history - little worse the last month. Using albuterol a few times a week with good benefit. Wheezing about 1-2 times a week with no triggers noted. Noticed some shortness of breath when flying and she is going to [place] for a wedding this week.    Today's spirometry was normal and slightly worse than previous.  ACT score 19.  Daily controller medication(s):start higher dose of Dulera 200 2 puffs twice a day with spacer and rinse mouth afterwards. Do this for 1-2 months and then go back to the [facility] 100 2 puffs twice a day.  Prior to physical activity:May use albuterol rescue inhaler 2 puffs 5 to 15 minutes prior to strenuous physical activities.  Rescue medications:May use albuterol rescue inhaler 2 puffs or nebulizer every 4 to 6 hours as needed for shortness of breath, chest tightness, coughing, and wheezing. Monitor frequency of use.  Make sure you have all your inhalers with you before your flight and take the albuterol 2 puffs prior to boarding the plane.   Other allergic rhinitis Past history - Perennial rhinoconjunctivitis symptoms for the past unknown years but worse the last year. 2019 Immunocap was positive to mold and dust mites. 2019 skin testing was positive to molds and cockroaches.   Interim history - Symptoms slightly worse the last month. Declines starting allergy immunotherapy due to needle phobia.  Continueenvironmental control measures.  Continue Singulair 10mg  daily.  Take Zyrtec (cetirizine) 10mg  daily.  Continue Xhance 2 sprays per nostril 1-2 times a day.  May use Patanol 0.1% 1 drop per eye twice a day as needed for itch/watery eyes.   Allergic conjunctivitis of both eyes  See assessment and plan as above for allergic rhinitis.   Return in about 4 months (around 12/03/2019).  Meds ordered this encounter  Medications  .  olopatadine (PATANOL) 0.1 % ophthalmic solution    Sig: Place 1 drop into both eyes 2 (two) times daily as needed for allergies (itchy/watery eyes).    Dispense:  5 mL    Refill:  5  . cetirizine (ZYRTEC) 10 MG tablet    Sig: Take 1 tablet (10 mg total) by mouth daily.    Dispense:  30 tablet    Refill:  5  . mometasone-formoterol (DULERA) 200-5 MCG/ACT AERO    Sig:  Inhale 2 puffs into the lungs 2 (two) times daily.    Dispense:  13 g    Refill:  5   Diagnostics: Spirometry:  Tracings reviewed. Her effort: It was hard to get consistent efforts and there is a question as to whether this reflects a maximal maneuver. FVC: 3.32L FEV1: 2.81L, 98% predicted FEV1/FVC ratio: 85% Interpretation: Spirometry consistent with normal pattern but efforts were not ideal and worse than previous.  Please see scanned spirometry results for details.  Medication List:  Current Outpatient Medications  Medication Sig Dispense Refill  . albuterol (VENTOLIN HFA) 108 (90 Base) MCG/ACT inhaler Inhale 2 puffs into the lungs every 6 (six) hours as needed for wheezing or shortness of breath. 6.7 g 1  . cetirizine (ZYRTEC) 10 MG tablet Take 1 tablet (10 mg total) by mouth daily. 30 tablet 5  . Fluticasone Propionate (XHANCE) 93 MCG/ACT EXHU Place 2 sprays into the nose 2 (two) times daily. 32 mL 5  . mometasone-formoterol (DULERA) 100-5 MCG/ACT AERO Inhale 2 puffs into the lungs 2 (two) times daily. 8.8 g 3  . montelukast (SINGULAIR) 10 MG tablet Take 1 tablet (10 mg total) by mouth at bedtime. 30 tablet 5  . norgestimate-ethinyl estradiol (ORTHO-CYCLEN) 0.25-35 MG-MCG tablet Take 1 tablet by mouth daily. 1 Package 11  . mometasone-formoterol (DULERA) 200-5 MCG/ACT AERO Inhale 2 puffs into the lungs 2 (two) times daily. 13 g 5  . olopatadine (PATANOL) 0.1 % ophthalmic solution Place 1 drop into both eyes 2 (two) times daily as needed for allergies (itchy/watery eyes). 5 mL 5   No current facility-administered medications for this visit.    Allergies: No Known Allergies I reviewed her past medical history, social history, family history, and environmental history and no significant changes have been reported from her previous visit.  Review of Systems  Constitutional: Negative for appetite change, chills, fever and unexpected weight change.  HENT: Positive for congestion.  Negative for rhinorrhea.   Eyes: Positive for itching.  Respiratory: Positive for wheezing. Negative for cough, chest tightness and shortness of breath.   Cardiovascular: Negative for chest pain.  Gastrointestinal: Negative for abdominal pain.  Genitourinary: Negative for difficulty urinating.  Skin: Negative for rash.  Allergic/Immunologic: Positive for environmental allergies. Negative for food allergies.  Neurological: Negative for headaches.   Objective: BP 118/70 (BP Location: Left Arm, Patient Position: Sitting, Cuff Size: Large)   Pulse 62   Temp 97.8 F (36.6 C) (Temporal)   Resp 17   Ht 5\' 6"  (1.676 m)   Wt 273 lb 9.6 oz (124.1 kg)   SpO2 96%   BMI 44.16 kg/m  Body mass index is 44.16 kg/m. Physical Exam  Constitutional: She is oriented to person, place, and time. She appears well-developed and well-nourished.  HENT:  Head: Normocephalic and atraumatic.  Right Ear: External ear normal.  Left Ear: External ear normal.  Mouth/Throat: Oropharynx is clear and moist.  Eyes: Conjunctivae and EOM are normal.  Neck: Neck supple.  Cardiovascular: Normal rate,  regular rhythm and normal heart sounds. Exam reveals no gallop and no friction rub.  No murmur heard. Pulmonary/Chest: Effort normal and breath sounds normal. She has no wheezes. She has no rales.  Abdominal: Soft.  Neurological: She is alert and oriented to person, place, and time.  Skin: Skin is warm. No rash noted.  Psychiatric: She has a normal mood and affect. Her behavior is normal.  Nursing note and vitals reviewed.  Previous notes and tests were reviewed. The plan was reviewed with the patient/family, and all questions/concerned were addressed.  It was my pleasure to see Bronson IngYvette today and participate in her care. Please feel free to contact me with any questions or concerns.  Sincerely,  Wyline MoodYoon Zuri Lascala, DO Allergy & Immunology  [facility] [place] office: [phone] [facility]  [place] office: [phone] [place] office: [phone]

## 2019-08-02 NOTE — Patient Instructions (Addendum)
Moderate persistent asthma  Today's spirometry was normal.   Daily controller medication(s):start higher dose of Dulera 200 2 puffs twice a day with spacer and rinse mouth afterwards. Do this for 1-2 months and then go back to the [facility] 100 2 puffs twice a day.  Prior to physical activity:May use albuterol rescue inhaler 2 puffs 5 to 15 minutes prior to strenuous physical activities.  Rescue medications:May use albuterol rescue inhaler 2 puffs or nebulizer every 4 to 6 hours as needed for shortness of breath, chest tightness, coughing, and wheezing. Monitor frequency of use.  Make sure you have all your inhalers with you before your flight and you should take the albuterol 2 puffs prior to boarding the plane.  Asthma control goals:  Full participation in all desired activities (may need albuterol before activity) Albuterol use two times or less a week on average (not counting use with activity) Cough interfering with sleep two times or less a month Oral steroids no more than once a year No hospitalizations  Allergic rhino conjunctivitis Past allergy testing was positive to molds, dust mites and cockroaches.    Continueenvironmental control measures.  Continue Singulair 10mg  daily.  Take Zyrtec (cetirizine) 10mg  daily.  Continue Xhance 2 sprays per nostril 1-2 times a day.  May use Patanol 0.1% 1 drop per eye twice a day as needed for itch/watery eyes.   Other atopic dermatitis  Continue proper skin care measures.  Follow up in 4 months or sooner if needed.  Control of House Dust Mite Allergen . Dust mite allergens are a common trigger of allergy and asthma symptoms. While they can be found throughout the house, these microscopic creatures thrive in warm, humid environments such as bedding, upholstered furniture and carpeting. . Because so much time is spent in the bedroom, it is essential to reduce mite levels there.  . Encase pillows, mattresses, and box springs in  special allergen-proof fabric covers or airtight, zippered plastic covers.  . Bedding should be washed weekly in hot water (130 F) and dried in a hot dryer. Allergen-proof covers are available for comforters and pillows that can't be regularly washed.  Wendee Copp the allergy-proof covers every few months. Minimize clutter in the bedroom. Keep pets out of the bedroom.  Marland Kitchen Keep humidity less than 50% by using a dehumidifier or air conditioning. You can buy a humidity measuring device called a hygrometer to monitor this.  . If possible, replace carpets with hardwood, linoleum, or washable area rugs. If that's not possible, vacuum frequently with a vacuum that has a HEPA filter. . Remove all upholstered furniture and non-washable window drapes from the bedroom. . Remove all non-washable stuffed toys from the bedroom.  Wash stuffed toys weekly. Cockroach Allergen Avoidance Cockroaches are often found in the homes of densely populated urban areas, schools or commercial buildings, but these creatures can lurk almost anywhere. This does not mean that you have a dirty house or living area. . Block all areas where roaches can enter the home. This includes crevices, wall cracks and windows.  . Cockroaches need water to survive, so fix and seal all leaky faucets and pipes. Have an exterminator go through the house when your family and pets are gone to eliminate any remaining roaches. Marland Kitchen Keep food in lidded containers and put pet food dishes away after your pets are done eating. Vacuum and sweep the floor after meals, and take out garbage and recyclables. Use lidded garbage containers in the kitchen. Wash dishes immediately after  use and clean under stoves, refrigerators or toasters where crumbs can accumulate. Wipe off the stove and other kitchen surfaces and cupboards regularly. Mold Control . Mold and fungi can grow on a variety of surfaces provided certain temperature and moisture conditions exist.  . Outdoor molds  grow on plants, decaying vegetation and soil. The major outdoor mold, Alternaria and Cladosporium, are found in very high numbers during hot and dry conditions. Generally, a late summer - fall peak is seen for common outdoor fungal spores. Rain will temporarily lower outdoor mold spore count, but counts rise rapidly when the rainy period ends. . The most important indoor molds are Aspergillus and Penicillium. Dark, humid and poorly ventilated basements are ideal sites for mold growth. The next most common sites of mold growth are the bathroom and the kitchen. Outdoor (Seasonal) Mold Control . Use air conditioning and keep windows closed. . Avoid exposure to decaying vegetation. Marland Kitchen Avoid leaf raking. . Avoid grain handling. . Consider wearing a face mask if working in moldy areas.  Indoor (Perennial) Mold Control  . Maintain humidity below 50%. . Get rid of mold growth on hard surfaces with water, detergent and, if necessary, 5% bleach (do not mix with other cleaners). Then dry the area completely. If mold covers an area more than 10 square feet, consider hiring an indoor environmental professional. . For clothing, washing with soap and water is best. If moldy items cannot be cleaned and dried, throw them away. . Remove sources e.g. contaminated carpets. . Repair and seal leaking roofs or pipes. Using dehumidifiers in damp basements may be helpful, but empty the water and clean units regularly to prevent mildew from forming. All rooms, especially basements, bathrooms and kitchens, require ventilation and cleaning to deter mold and mildew growth. Avoid carpeting on concrete or damp floors, and storing items in damp areas.

## 2019-08-02 NOTE — Assessment & Plan Note (Signed)
Past history - Respiratory symptoms for unknown years but worse the last year which seems to flare with her allergies and exertion.   Interim history - little worse the last month. Using albuterol a few times a week with good benefit. Wheezing about 1-2 times a week with no triggers noted. Noticed some shortness of breath when flying and she is going to [place] for a wedding this week.   Today's spirometry was normal and slightly worse than previous.  ACT score 19.  Daily controller medication(s):start higher dose of Dulera 200 2 puffs twice a day with spacer and rinse mouth afterwards. Do this for 1-2 months and then go back to the [facility] 100 2 puffs twice a day.  Prior to physical activity:May use albuterol rescue inhaler 2 puffs 5 to 15 minutes prior to strenuous physical activities.  Rescue medications:May use albuterol rescue inhaler 2 puffs or nebulizer every 4 to 6 hours as needed for shortness of breath, chest tightness, coughing, and wheezing. Monitor frequency of use.  Make sure you have all your inhalers with you before your flight and take the albuterol 2 puffs prior to boarding the plane.

## 2019-08-03 ENCOUNTER — Telehealth: Payer: Self-pay

## 2019-08-03 MED ORDER — PAZEO 0.7 % OP SOLN: 1.0000 [drp] | OPHTHALMIC | 5 refills | Status: DC

## 2019-08-03 NOTE — Telephone Encounter (Signed)
Fax from [place]:  Patanol not covered Alternative includes:  Pazeo Will change to [employer], since it is on medicaid formulary Has not tried any other eye drops

## 2019-08-04 ENCOUNTER — Encounter (INDEPENDENT_AMBULATORY_CARE_PROVIDER_SITE_OTHER): Payer: Self-pay | Admitting: Primary Care

## 2019-08-04 ENCOUNTER — Ambulatory Visit (INDEPENDENT_AMBULATORY_CARE_PROVIDER_SITE_OTHER): Payer: Medicaid Other | Admitting: Primary Care

## 2019-08-04 ENCOUNTER — Other Ambulatory Visit: Payer: Self-pay | Admitting: *Deleted

## 2019-08-04 ENCOUNTER — Other Ambulatory Visit: Payer: Self-pay

## 2019-08-04 VITALS — BP 116/76 | HR 65 | Temp 97.1°F | Ht 66.0 in | Wt 271.0 lb

## 2019-08-04 DIAGNOSIS — Z79899 Other long term (current) drug therapy: Secondary | ICD-10-CM | POA: Diagnosis not present

## 2019-08-04 DIAGNOSIS — Z6841 Body Mass Index (BMI) 40.0 and over, adult: Secondary | ICD-10-CM | POA: Diagnosis not present

## 2019-08-04 DIAGNOSIS — Z713 Dietary counseling and surveillance: Secondary | ICD-10-CM | POA: Diagnosis not present

## 2019-08-04 DIAGNOSIS — R0602 Shortness of breath: Secondary | ICD-10-CM

## 2019-08-04 DIAGNOSIS — J45909 Unspecified asthma, uncomplicated: Secondary | ICD-10-CM | POA: Diagnosis not present

## 2019-08-04 MED ORDER — PHENTERMINE HCL 37.5 MG PO CAPS: 37.5000 mg | ORAL_CAPSULE | ORAL | 0 refills | Status: DC

## 2019-08-04 MED ORDER — PAZEO 0.7 % OP SOLN: 1.0000 [drp] | Freq: Every day | OPHTHALMIC | 5 refills | Status: DC | PRN

## 2019-08-04 NOTE — Patient Instructions (Signed)
Phentermine tablets or capsules What is this medicine? PHENTERMINE (FEN ter meen) decreases your appetite. It is used with a reduced calorie diet and exercise to help you lose weight. This medicine may be used for other purposes; ask your health care provider or pharmacist if you have questions. COMMON BRAND NAME(S): Adipex-P, Atti-Plex P, Atti-Plex P Spansule, Fastin, Lomaira, Pro-Fast, Tara-8 What should I tell my health care provider before I take this medicine? They need to know if you have any of these conditions:  agitation or nervousness  diabetes  glaucoma  heart disease  high blood pressure  history of drug abuse or addiction  history of stroke  kidney disease  lung disease called Primary Pulmonary Hypertension (PPH)  taken an MAOI like Carbex, Eldepryl, Marplan, Nardil, or Parnate in last 14 days  taking stimulant medicines for attention disorders, weight loss, or to stay awake  thyroid disease  an unusual or allergic reaction to phentermine, other medicines, foods, dyes, or preservatives  pregnant or trying to get pregnant  breast-feeding How should I use this medicine? Take this medicine by mouth with a glass of water. Follow the directions on the prescription label. The instructions for use may differ based on the product and dose you are taking. Avoid taking this medicine in the evening. It may interfere with sleep. Take your doses at regular intervals. Do not take your medicine more often than directed. Talk to your pediatrician regarding the use of this medicine in children. While this drug may be prescribed for children 17 years or older for selected conditions, precautions do apply. Overdosage: If you think you have taken too much of this medicine contact a poison control center or emergency room at once. NOTE: This medicine is only for you. Do not share this medicine with others. What if I miss a dose? If you miss a dose, take it as soon as you can. If it  is almost time for your next dose, take only that dose. Do not take double or extra doses. What may interact with this medicine? Do not take this medicine with any of the following medications:  MAOIs like Carbex, Eldepryl, Marplan, Nardil, and Parnate  medicines for colds or breathing difficulties like pseudoephedrine or phenylephrine  procarbazine  sibutramine  stimulant medicines for attention disorders, weight loss, or to stay awake This medicine may also interact with the following medications:  certain medicines for depression, anxiety, or psychotic disturbances  linezolid  medicines for diabetes  medicines for high blood pressure This list may not describe all possible interactions. Give your health care provider a list of all the medicines, herbs, non-prescription drugs, or dietary supplements you use. Also tell them if you smoke, drink alcohol, or use illegal drugs. Some items may interact with your medicine. What should I watch for while using this medicine? Notify your physician immediately if you become short of breath while doing your normal activities. Do not take this medicine within 6 hours of bedtime. It can keep you from getting to sleep. Avoid drinks that contain caffeine and try to stick to a regular bedtime every night. This medicine was intended to be used in addition to a healthy diet and exercise. The best results are achieved this way. This medicine is only indicated for short-term use. Eventually your weight loss may level out. At that point, the drug will only help you maintain your new weight. Do not increase or in any way change your dose without consulting your doctor. You may get   drowsy or dizzy. Do not drive, use machinery, or do anything that needs mental alertness until you know how this medicine affects you. Do not stand or sit up quickly, especially if you are an older patient. This reduces the risk of dizzy or fainting spells. Alcohol may increase  dizziness and drowsiness. Avoid alcoholic drinks. What side effects may I notice from receiving this medicine? Side effects that you should report to your doctor or health care professional as soon as possible:  allergic reactions like skin rash, itching or hives, swelling of the face, lips, or tongue)  anxiety  breathing problems  changes in vision  chest pain or chest tightness  depressed mood or other mood changes  hallucinations, loss of contact with reality  fast, irregular heartbeat  increased blood pressure  irritable  nervousness or restlessness  painful urination  palpitations  tremors  trouble sleeping  seizures  signs and symptoms of a stroke like changes in vision; confusion; trouble speaking or understanding; severe headaches; sudden numbness or weakness of the face, arm or leg; trouble walking; dizziness; loss of balance or coordination  unusually weak or tired  vomiting Side effects that usually do not require medical attention (report to your doctor or health care professional if they continue or are bothersome):  constipation or diarrhea  dry mouth  headache  nausea  stomach upset  sweating This list may not describe all possible side effects. Call your doctor for medical advice about side effects. You may report side effects to FDA at 1-800-FDA-1088. Where should I keep my medicine? Keep out of the reach of children. This medicine can be abused. Keep your medicine in a safe place to protect it from theft. Do not share this medicine with anyone. Selling or giving away this medicine is dangerous and against the law. This medicine may cause accidental overdose and death if taken by other adults, children, or pets. Mix any unused medicine with a substance like cat litter or coffee grounds. Then throw the medicine away in a sealed container like a sealed bag or a coffee can with a lid. Do not use the medicine after the expiration date. Store at  room temperature between 20 and 25 degrees C (68 and 77 degrees F). Keep container tightly closed. NOTE: This sheet is a summary. It may not cover all possible information. If you have questions about this medicine, talk to your doctor, pharmacist, or health care provider.  2020 Elsevier/Gold Standard (2017-03-07 08:23:13)  

## 2019-08-04 NOTE — Progress Notes (Signed)
Established Patient Office Visit  Subjective:  Patient ID: Mckenzie [name], female    DOB: 1985/11/16  Age: 33 y.o. MRN: [id number]  CC:  Chief Complaint  Patient presents with  . weight management    HPI Mckenzie [name] presents for weight loss management she is actively exercising and diet modification no fried foods all baked or grilled.   Past Medical History:  Diagnosis Date  . Asthma   . Eczema     No past surgical history on file.  No family history on file.  Social History   Socioeconomic History  . Marital status: Single    Spouse name: Not on file  . Number of children: Not on file  . Years of education: Not on file  . Highest education level: Not on file  Occupational History  . Not on file  Social Needs  . Financial resource strain: Not on file  . Food insecurity    Worry: Not on file    Inability: Not on file  . Transportation needs    Medical: Not on file    Non-medical: Not on file  Tobacco Use  . Smoking status: Never Smoker  . Smokeless tobacco: Never Used  Substance and Sexual Activity  . Alcohol use: Never    Frequency: Never  . Drug use: Never  . Sexual activity: Yes    Birth control/protection: I.U.D.  Lifestyle  . Physical activity    Days per week: Not on file    Minutes per session: Not on file  . Stress: Not on file  Relationships  . Social Herbalist on phone: Not on file    Gets together: Not on file    Attends religious service: Not on file    Active member of club or organization: Not on file    Attends meetings of clubs or organizations: Not on file    Relationship status: Not on file  . Intimate partner violence    Fear of current or ex partner: Not on file    Emotionally abused: Not on file    Physically abused: Not on file    Forced sexual activity: Not on file  Other Topics Concern  . Not on file  Social History Narrative  . Not on file    Outpatient Medications Prior to Visit  Medication Sig  Dispense Refill  . albuterol (VENTOLIN HFA) 108 (90 Base) MCG/ACT inhaler Inhale 2 puffs into the lungs every 6 (six) hours as needed for wheezing or shortness of breath. 6.7 g 1  . cetirizine (ZYRTEC) 10 MG tablet Take 1 tablet (10 mg total) by mouth daily. 30 tablet 5  . Fluticasone Propionate (XHANCE) 93 MCG/ACT EXHU Place 2 sprays into the nose 2 (two) times daily. 32 mL 5  . mometasone-formoterol (DULERA) 100-5 MCG/ACT AERO Inhale 2 puffs into the lungs 2 (two) times daily. 8.8 g 3  . mometasone-formoterol (DULERA) 200-5 MCG/ACT AERO Inhale 2 puffs into the lungs 2 (two) times daily. 13 g 5  . montelukast (SINGULAIR) 10 MG tablet Take 1 tablet (10 mg total) by mouth at bedtime. 30 tablet 5  . norgestimate-ethinyl estradiol (ORTHO-CYCLEN) 0.25-35 MG-MCG tablet Take 1 tablet by mouth daily. 1 Package 11  . olopatadine (PATANOL) 0.1 % ophthalmic solution Place 1 drop into both eyes 2 (two) times daily as needed for allergies (itchy/watery eyes). 5 mL 5  . Olopatadine HCl (PAZEO) 0.7 % SOLN Place 1 drop into both eyes daily as needed. 2.5 mL  5   No facility-administered medications prior to visit.     No Known Allergies  ROS Review of Systems  Respiratory: Positive for shortness of breath.        Only with exercise has dx of asthma  Musculoskeletal:       Right knee hurts after exercising   All other systems reviewed and are negative.     Objective:    Physical Exam  Constitutional: She is oriented to person, place, and time. She appears well-developed and well-nourished.  Morbid obesity  HENT:  Head: Normocephalic.  Neck: Neck supple.  Cardiovascular: Normal rate and regular rhythm.  Pulmonary/Chest: Effort normal and breath sounds normal.  Abdominal: Soft. Bowel sounds are normal.  Musculoskeletal: Normal range of motion.  Neurological: She is oriented to person, place, and time.  Psychiatric: She has a normal mood and affect.    BP 116/76 (BP Location: Left Arm, Patient  Position: Sitting, Cuff Size: Large)   Pulse 65   Temp (!) 97.1 F (36.2 C) (Temporal)   Ht 5\' 6"  (1.676 m)   Wt 271 lb (122.9 kg)   LMP 08/04/2019   SpO2 97%   BMI 43.74 kg/m  Wt Readings from Last 3 Encounters:  08/04/19 271 lb (122.9 kg)  08/02/19 273 lb 9.6 oz (124.1 kg)  05/12/19 266 lb (120.7 kg)     There are no preventive care reminders to display for this patient.  There are no preventive care reminders to display for this patient.  Lab Results  Component Value Date   TSH 2.840 05/04/2019   Lab Results  Component Value Date   WBC 9.8 05/04/2019   HGB 13.0 05/04/2019   HCT 40.2 05/04/2019   MCV 97 05/04/2019   PLT 373 05/04/2019   Lab Results  Component Value Date   NA 141 05/04/2019   K 4.0 05/04/2019   CO2 24 05/04/2019   GLUCOSE 100 (H) 05/04/2019   BUN 8 05/04/2019   CREATININE 0.72 05/04/2019   BILITOT 0.3 05/04/2019   ALKPHOS 52 05/04/2019   AST 18 05/04/2019   ALT 25 05/04/2019   PROT 6.4 05/04/2019   ALBUMIN 3.8 05/04/2019   CALCIUM 9.1 05/04/2019   Lab Results  Component Value Date   CHOL 144 05/04/2019   Lab Results  Component Value Date   HDL 55 05/04/2019   Lab Results  Component Value Date   LDLCALC 78 05/04/2019   Lab Results  Component Value Date   TRIG 56 05/04/2019   Lab Results  Component Value Date   CHOLHDL 2.6 05/04/2019   Lab Results  Component Value Date   HGBA1C 5.7 (A) 05/04/2019      Assessment & Plan:  Mckenzie [name] was seen today for weight management.  Diagnoses and all orders for this visit:  High risk medication use -     EKG 12-Lead  Morbid obesity (HCC) EKG normal she does meet criteria for an appetite depressant BMI 43.74 weight loss may reduce the risk of CVD, HTN, diabetes and/or hyperventilation syndrome.  Other orders -     phentermine 37.5 MG capsule; Take 1 capsule (37.5 mg total) by mouth every morning.    Meds ordered this encounter  Medications  . phentermine 37.5 MG capsule     Sig: Take 1 capsule (37.5 mg total) by mouth every morning.    Dispense:  30 capsule    Refill:  0    Follow-up: Return in about 1 month (around 09/04/2019) for weight loss  management.    Grayce Sessions, NP

## 2019-09-01 ENCOUNTER — Other Ambulatory Visit: Payer: Self-pay

## 2019-09-01 ENCOUNTER — Ambulatory Visit (INDEPENDENT_AMBULATORY_CARE_PROVIDER_SITE_OTHER): Payer: Medicaid Other | Admitting: Primary Care

## 2019-09-01 ENCOUNTER — Encounter (INDEPENDENT_AMBULATORY_CARE_PROVIDER_SITE_OTHER): Payer: Self-pay | Admitting: Primary Care

## 2019-09-01 DIAGNOSIS — Z6841 Body Mass Index (BMI) 40.0 and over, adult: Secondary | ICD-10-CM | POA: Diagnosis not present

## 2019-09-01 MED ORDER — PHENTERMINE HCL 37.5 MG PO CAPS: 37.5000 mg | ORAL_CAPSULE | ORAL | 0 refills | Status: DC

## 2019-09-01 NOTE — Progress Notes (Signed)
Established Patient Office Visit  Subjective:  Patient ID: Mckenzie [name], female    DOB: [birth year]  Age: 33 y.o. MRN: [id number]  CC:  Chief Complaint  Patient presents with  . Weight Check    HPI Mckenzie [name] presents for management of weight loss she is down 7 lbs from last month. She denies palpitation or adverse affects of phentermine 37.5 mg daily.  Past Medical History:  Diagnosis Date  . Asthma   . Eczema    . No past surgical history on file.  No family history on file.  Social History   Socioeconomic History  . Marital status: Single    Spouse name: Not on file  . Number of children: Not on file  . Years of education: Not on file  . Highest education level: Not on file  Occupational History  . Not on file  Social Needs  . Financial resource strain: Not on file  . Food insecurity    Worry: Not on file    Inability: Not on file  . Transportation needs    Medical: Not on file    Non-medical: Not on file  Tobacco Use  . Smoking status: Never Smoker  . Smokeless tobacco: Never Used  Substance and Sexual Activity  . Alcohol use: Never    Frequency: Never  . Drug use: Never  . Sexual activity: Yes    Birth control/protection: I.U.D.  Lifestyle  . Physical activity    Days per week: Not on file    Minutes per session: Not on file  . Stress: Not on file  Relationships  . Social Herbalist on phone: Not on file    Gets together: Not on file    Attends religious service: Not on file    Active member of club or organization: Not on file    Attends meetings of clubs or organizations: Not on file    Relationship status: Not on file  . Intimate partner violence    Fear of current or ex partner: Not on file    Emotionally abused: Not on file    Physically abused: Not on file    Forced sexual activity: Not on file  Other Topics Concern  . Not on file  Social History Narrative  . Not on file    Outpatient Medications Prior to Visit   Medication Sig Dispense Refill  . albuterol (VENTOLIN HFA) 108 (90 Base) MCG/ACT inhaler Inhale 2 puffs into the lungs every 6 (six) hours as needed for wheezing or shortness of breath. 6.7 g 1  . cetirizine (ZYRTEC) 10 MG tablet Take 1 tablet (10 mg total) by mouth daily. 30 tablet 5  . Fluticasone Propionate (XHANCE) 93 MCG/ACT EXHU Place 2 sprays into the nose 2 (two) times daily. 32 mL 5  . mometasone-formoterol (DULERA) 200-5 MCG/ACT AERO Inhale 2 puffs into the lungs 2 (two) times daily. 13 g 5  . montelukast (SINGULAIR) 10 MG tablet Take 1 tablet (10 mg total) by mouth at bedtime. 30 tablet 5  . norgestimate-ethinyl estradiol (ORTHO-CYCLEN) 0.25-35 MG-MCG tablet Take 1 tablet by mouth daily. 1 Package 11  . olopatadine (PATANOL) 0.1 % ophthalmic solution Place 1 drop into both eyes 2 (two) times daily as needed for allergies (itchy/watery eyes). 5 mL 5  . Olopatadine HCl (PAZEO) 0.7 % SOLN Place 1 drop into both eyes daily as needed. 2.5 mL 5  . phentermine 37.5 MG capsule Take 1 capsule (37.5 mg total) by mouth  every morning. 30 capsule 0  . mometasone-formoterol (DULERA) 100-5 MCG/ACT AERO Inhale 2 puffs into the lungs 2 (two) times daily. (Patient not taking: Reported on 09/01/2019) 8.8 g 3   No facility-administered medications prior to visit.     No Known Allergies  ROS Review of Systems  All other systems reviewed and are negative.     Objective:    Physical Exam  Constitutional: She is oriented to person, place, and time. She appears well-developed and well-nourished.  Morbid obesity   HENT:  Head: Normocephalic.  Neck: Neck supple.  Pulmonary/Chest: Effort normal and breath sounds normal.  Abdominal: Soft. Bowel sounds are normal.  Musculoskeletal: Normal range of motion.  Neurological: She is oriented to person, place, and time.  Psychiatric: She has a normal mood and affect.    BP 115/75 (BP Location: Left Arm, Patient Position: Sitting, Cuff Size: Large)    Pulse 75   Temp (!) 97.1 F (36.2 C) (Temporal)   Resp 16   Ht 5\' 7"  (1.702 m)   Wt 264 lb (119.7 kg)   LMP 08/04/2019   SpO2 96%   BMI 41.35 kg/m  Wt Readings from Last 3 Encounters:  09/01/19 264 lb (119.7 kg)  08/04/19 271 lb (122.9 kg)  08/02/19 273 lb 9.6 oz (124.1 kg)     There are no preventive care reminders to display for this patient.  There are no preventive care reminders to display for this patient.  Lab Results  Component Value Date   TSH 2.840 05/04/2019   Lab Results  Component Value Date   WBC 9.8 05/04/2019   HGB 13.0 05/04/2019   HCT 40.2 05/04/2019   MCV 97 05/04/2019   PLT 373 05/04/2019   Lab Results  Component Value Date   NA 141 05/04/2019   K 4.0 05/04/2019   CO2 24 05/04/2019   GLUCOSE 100 (H) 05/04/2019   BUN 8 05/04/2019   CREATININE 0.72 05/04/2019   BILITOT 0.3 05/04/2019   ALKPHOS 52 05/04/2019   AST 18 05/04/2019   ALT 25 05/04/2019   PROT 6.4 05/04/2019   ALBUMIN 3.8 05/04/2019   CALCIUM 9.1 05/04/2019   Lab Results  Component Value Date   CHOL 144 05/04/2019   Lab Results  Component Value Date   HDL 55 05/04/2019   Lab Results  Component Value Date   LDLCALC 78 05/04/2019   Lab Results  Component Value Date   TRIG 56 05/04/2019   Lab Results  Component Value Date   CHOLHDL 2.6 05/04/2019   Lab Results  Component Value Date   HGBA1C 5.7 (A) 05/04/2019      Assessment & Plan:  Baelyn was seen today for weight check.  Diagnoses and all orders for this visit:  Morbid obesity (HCC) Excercisng and diet modification with appetite depressant down 7 lbs. Other orders -     phentermine 37.5 MG capsule; Take 1 capsule (37.5 mg total) by mouth every morning.     No orders of the defined types were placed in this encounter.   Follow-up: No follow-ups on file.  Tele visit 1 month weight management check  Bronson Ing, NP

## 2019-09-01 NOTE — Patient Instructions (Signed)
Phentermine sustained-release capsules What is this medicine? PHENTERMINE (FEN ter meen) decreases your appetite. It is used with a reduced calorie diet and exercise to help you lose weight. This medicine may be used for other purposes; ask your health care provider or pharmacist if you have questions. COMMON BRAND NAME(S): Ionamin, Pro-Fast What should I tell my health care provider before I take this medicine? They need to know if you have any of these conditions:  agitation or nervousness  diabetes  glaucoma  heart disease  high blood pressure  history of drug abuse or addiction  history of stroke  kidney disease  lung disease called Primary Pulmonary Hypertension (PPH)  taken an MAOI like Carbex, Eldepryl, Marplan, Nardil, or Parnate in last 14 days  taking stimulant medicines for attention disorders, weight loss, or to stay awake  thyroid disease  an unusual or allergic reaction to phentermine, other medicines, foods, dyes, or preservatives  pregnant or trying to get pregnant  breast-feeding How should I use this medicine? Take this medicine by mouth with a glass of water. Follow the directions on the prescription label. This medicine is usually taken before breakfast or at least 10 to 14 hours before going to bed. Avoid taking this medicine in the evening. It may interfere with sleep. Swallow whole. Do not open or chew the capsules. Take your doses at regular intervals. Do not take your medicine more often than directed. Talk to your pediatrician regarding the use of this medicine in children. Special care may be needed. Overdosage: If you think you have taken too much of this medicine contact a poison control center or emergency room at once. NOTE: This medicine is only for you. Do not share this medicine with others. What if I miss a dose? If you miss a dose, take it as soon as you can. If it is almost time for your next dose, take only that dose. Do not take double  or extra doses. What may interact with this medicine? Do not take this medicine with any of the following medications:  MAOIs like Carbex, Eldepryl, Marplan, Nardil, and Parnate  medicines for colds or breathing difficulties like pseudoephedrine or phenylephrine  procarbazine  sibutramine  stimulant medicines for attention disorders, weight loss, or to stay awake This medicine may also interact with the following medications:  certain medicines for depression, anxiety, or psychotic disturbances  linezolid  medicines for diabetes  medicines for high blood pressure This list may not describe all possible interactions. Give your health care provider a list of all the medicines, herbs, non-prescription drugs, or dietary supplements you use. Also tell them if you smoke, drink alcohol, or use illegal drugs. Some items may interact with your medicine. What should I watch for while using this medicine? Notify your physician immediately if you become short of breath while doing your normal activities. Do not take this medicine within 6 hours of bedtime. It can keep you from getting to sleep. Avoid drinks that contain caffeine and try to stick to a regular bedtime every night. This medicine was intended to be used in addition to a healthy diet and exercise. The best results are achieved this way. This medicine is only indicated for short-term use. Eventually your weight loss may level out. At that point, the drug will only help you maintain your new weight. Do not increase or in any way change your dose without consulting your doctor. You may get drowsy or dizzy. Do not drive, use machinery, or do  anything that needs mental alertness until you know how this medicine affects you. Do not stand or sit up quickly, especially if you are an older patient. This reduces the risk of dizzy or fainting spells. Alcohol may increase dizziness and drowsiness. Avoid alcoholic drinks. What side effects may I  notice from receiving this medicine? Side effects that you should report to your doctor or health care professional as soon as possible:  allergic reactions like skin rash, itching or hives, swelling of the face, lips, or tongue)  anxiety  breathing problems  changes in vision  chest pain or chest tightness  depressed mood or other mood changes  hallucinations, loss of contact with reality  fast, irregular heartbeat  increased blood pressure  irritable  nervousness or restlessness  painful urination  palpitations  tremors  trouble sleeping  seizures  signs and symptoms of a stroke like changes in vision; confusion; trouble speaking or understanding; severe headaches; sudden numbness or weakness of the face, arm or leg; trouble walking; dizziness; loss of balance or coordination  unusually weak or tired  vomiting Side effects that usually do not require medical attention (report to your doctor or health care professional if they continue or are bothersome):  constipation or diarrhea  dry mouth  headache  nausea  stomach upset  sweating This list may not describe all possible side effects. Call your doctor for medical advice about side effects. You may report side effects to FDA at 1-800-FDA-1088. Where should I keep my medicine? Keep out of the reach of children. This medicine can be abused. Keep your medicine in a safe place to protect it from theft. Do not share this medicine with anyone. Selling or giving away this medicine is dangerous and against the law. This medicine may cause accidental overdose and death if taken by other adults, children, or pets. Mix any unused medicine with a substance like cat litter or coffee grounds. Then throw the medicine away in a sealed container like a sealed bag or a coffee can with a lid. Do not use the medicine after the expiration date. Store at room temperature between 20 and 25 degrees C (68 and 77 degrees F). Keep  container tightly closed. NOTE: This sheet is a summary. It may not cover all possible information. If you have questions about this medicine, talk to your doctor, pharmacist, or health care provider.  2020 Elsevier/Gold Standard (2017-03-07 08:21:52)

## 2019-09-01 NOTE — Progress Notes (Signed)
Weight management

## 2019-09-21 DIAGNOSIS — K029 Dental caries, unspecified: Secondary | ICD-10-CM | POA: Diagnosis not present

## 2019-10-04 ENCOUNTER — Encounter (INDEPENDENT_AMBULATORY_CARE_PROVIDER_SITE_OTHER): Payer: Self-pay | Admitting: Primary Care

## 2019-10-04 ENCOUNTER — Ambulatory Visit (INDEPENDENT_AMBULATORY_CARE_PROVIDER_SITE_OTHER): Payer: Medicaid Other | Admitting: Primary Care

## 2019-10-04 ENCOUNTER — Other Ambulatory Visit: Payer: Self-pay

## 2019-10-04 DIAGNOSIS — Z6835 Body mass index (BMI) 35.0-35.9, adult: Secondary | ICD-10-CM | POA: Diagnosis not present

## 2019-10-04 DIAGNOSIS — E6609 Other obesity due to excess calories: Secondary | ICD-10-CM

## 2019-10-04 MED ORDER — PHENTERMINE HCL 37.5 MG PO CAPS: 37.5000 mg | ORAL_CAPSULE | ORAL | 1 refills | Status: DC

## 2019-10-04 NOTE — Progress Notes (Signed)
Pt weight this AM at 10:26 is 258lbs

## 2019-10-04 NOTE — Progress Notes (Addendum)
Virtual Visit via Telephone Note  I connected with Rogue Jury on 10/04/19 at 10:10 AM EST by telephone and verified that I am speaking with the correct person using two identifiers.   I discussed the limitations, risks, security and privacy concerns of performing an evaluation and management service by telephone and the availability of in person appointments. I also discussed with the patient that there may be a patient responsible charge related to this service. The patient expressed understanding and agreed to proceed.   History of Present Illness: Ms. Sophy Mesler is having a tele visit for weight management she reports her weight as 258 last month 264 a 6lbs weight loss medication    Past Medical History:  Diagnosis Date  . Asthma   . Eczema    Current Outpatient Medications on File Prior to Visit  Medication Sig Dispense Refill  . albuterol (VENTOLIN HFA) 108 (90 Base) MCG/ACT inhaler Inhale 2 puffs into the lungs every 6 (six) hours as needed for wheezing or shortness of breath. 6.7 g 1  . cetirizine (ZYRTEC) 10 MG tablet Take 1 tablet (10 mg total) by mouth daily. 30 tablet 5  . Fluticasone Propionate (XHANCE) 93 MCG/ACT EXHU Place 2 sprays into the nose 2 (two) times daily. 32 mL 5  . mometasone-formoterol (DULERA) 200-5 MCG/ACT AERO Inhale 2 puffs into the lungs 2 (two) times daily. 13 g 5  . montelukast (SINGULAIR) 10 MG tablet Take 1 tablet (10 mg total) by mouth at bedtime. 30 tablet 5  . norgestimate-ethinyl estradiol (ORTHO-CYCLEN) 0.25-35 MG-MCG tablet Take 1 tablet by mouth daily. 1 Package 11  . olopatadine (PATANOL) 0.1 % ophthalmic solution Place 1 drop into both eyes 2 (two) times daily as needed for allergies (itchy/watery eyes). 5 mL 5  . Olopatadine HCl (PAZEO) 0.7 % SOLN Place 1 drop into both eyes daily as needed. 2.5 mL 5  . mometasone-formoterol (DULERA) 100-5 MCG/ACT AERO Inhale 2 puffs into the lungs 2 (two) times daily. (Patient not taking: Reported on  09/01/2019) 8.8 g 3   No current facility-administered medications on file prior to visit.   Observations/Objective: Review of Systems  All other systems reviewed and are negative.   Assessment and Plan: Kasy was seen today for weight management.  Diagnoses and all orders for this visit:  Class 2 obesity due to excess calories without serious comorbidity with body mass index (BMI) of 35.0 to 35.9 in adult Obesity is 30-39 indicating an excess in caloric intake or underlining conditions. This may lead to other co-morbidities examples include increase risk for respiratory problems, hypertension and diabetes.  Lifestyle modifications of diet and exercise may reduce obesity.   Body mass index (BMI) of 35.0-35.9 in adult Discussed diet and exercise for person with BMI >25. Instructed: You must burn more calories than you eat. Losing 5 percent of your body weight should be considered a success. In the longer term, losing more than 15 percent of your body weight and staying at this weight is an extremely good result. However, keep in mind that even losing 5 percent of your body weight leads to important health benefits, so try not to get discouraged if you're not able to lose more than this. Will recheck weight every month. -     phentermine 37.5 MG capsule; Take 1 capsule (37.5 mg total) by mouth every morning.   Follow Up Instructions:    I discussed the assessment and treatment plan with the patient. The patient was provided an opportunity to  ask questions and all were answered. The patient agreed with the plan and demonstrated an understanding of the instructions.   The patient was advised to call back or seek an in-person evaluation if the symptoms worsen or if the condition fails to improve as anticipated.  I provided 13 minutes of non-face-to-face time during this encounter.   Kerin Perna, NP

## 2019-10-07 DIAGNOSIS — K029 Dental caries, unspecified: Secondary | ICD-10-CM | POA: Diagnosis not present

## 2019-11-08 ENCOUNTER — Telehealth (INDEPENDENT_AMBULATORY_CARE_PROVIDER_SITE_OTHER): Payer: Medicaid Other | Admitting: Primary Care

## 2019-11-08 DIAGNOSIS — Z7689 Persons encountering health services in other specified circumstances: Secondary | ICD-10-CM | POA: Diagnosis not present

## 2019-11-08 NOTE — Progress Notes (Signed)
Virtual Visit via Telephone Note  I connected with Mckenzie [name] on 11/08/19 at 11:10 AM EST by telephone and verified that I am speaking with the correct person using two identifiers.   I discussed the limitations, risks, security and privacy concerns of performing an evaluation and management service by telephone and the availability of in person appointments. I also discussed with the patient that there may be a patient responsible charge related to this service. The patient expressed understanding and agreed to proceed.   History of Present Illness: Ms. Mckenzie [name] is having a tele visit for weight management. She expresses she does not want to continue to take phentermine instead of decreasing her appetite she had a reverse affect increased her appetite. She voice no others problems or concerns.   Past Medical History:  Diagnosis Date  . Asthma   . Eczema    Current Outpatient Medications on File Prior to Visit  Medication Sig Dispense Refill  . albuterol (VENTOLIN HFA) 108 (90 Base) MCG/ACT inhaler Inhale 2 puffs into the lungs every 6 (six) hours as needed for wheezing or shortness of breath. 6.7 g 1  . cetirizine (ZYRTEC) 10 MG tablet Take 1 tablet (10 mg total) by mouth daily. 30 tablet 5  . Fluticasone Propionate (XHANCE) 93 MCG/ACT EXHU Place 2 sprays into the nose 2 (two) times daily. 32 mL 5  . mometasone-formoterol (DULERA) 100-5 MCG/ACT AERO Inhale 2 puffs into the lungs 2 (two) times daily. (Patient not taking: Reported on 09/01/2019) 8.8 g 3  . mometasone-formoterol (DULERA) 200-5 MCG/ACT AERO Inhale 2 puffs into the lungs 2 (two) times daily. 13 g 5  . montelukast (SINGULAIR) 10 MG tablet Take 1 tablet (10 mg total) by mouth at bedtime. 30 tablet 5  . norgestimate-ethinyl estradiol (ORTHO-CYCLEN) 0.25-35 MG-MCG tablet Take 1 tablet by mouth daily. 1 Package 11  . olopatadine (PATANOL) 0.1 % ophthalmic solution Place 1 drop into both eyes 2 (two) times daily as needed  for allergies (itchy/watery eyes). 5 mL 5  . Olopatadine HCl (PAZEO) 0.7 % SOLN Place 1 drop into both eyes daily as needed. 2.5 mL 5   No current facility-administered medications on file prior to visit.   Observations/Objective: Review of Systems  All other systems reviewed and are negative.   Assessment and Plan: Diagnoses and all orders for this visit:  Encounter for weight management Discontinue phentermine cause increased in appetite instead of suppressing appetite . She has decided to diet and exercise for weight loss   Follow Up Instructions:    I discussed the assessment and treatment plan with the patient. The patient was provided an opportunity to ask questions and all were answered. The patient agreed with the plan and demonstrated an understanding of the instructions.   The patient was advised to call back or seek an in-person evaluation if the symptoms worsen or if the condition fails to improve as anticipated.  I provided 8 minutes of non-face-to-face time during this encounter.   Grayce Sessions, NP

## 2019-11-16 ENCOUNTER — Encounter (INDEPENDENT_AMBULATORY_CARE_PROVIDER_SITE_OTHER): Payer: Self-pay | Admitting: Primary Care

## 2019-12-16 DIAGNOSIS — R5381 Other malaise: Secondary | ICD-10-CM | POA: Diagnosis not present

## 2019-12-16 DIAGNOSIS — R69 Illness, unspecified: Secondary | ICD-10-CM | POA: Diagnosis not present

## 2019-12-20 ENCOUNTER — Telehealth (INDEPENDENT_AMBULATORY_CARE_PROVIDER_SITE_OTHER): Payer: Medicaid Other | Admitting: Primary Care

## 2019-12-20 ENCOUNTER — Other Ambulatory Visit (INDEPENDENT_AMBULATORY_CARE_PROVIDER_SITE_OTHER): Payer: Self-pay | Admitting: Primary Care

## 2019-12-20 DIAGNOSIS — J4540 Moderate persistent asthma, uncomplicated: Secondary | ICD-10-CM | POA: Diagnosis not present

## 2019-12-20 MED ORDER — ALBUTEROL SULFATE HFA 108 (90 BASE) MCG/ACT IN AERS: 2.0000 | INHALATION_SPRAY | Freq: Four times a day (QID) | RESPIRATORY_TRACT | 1 refills | Status: DC | PRN

## 2019-12-20 MED ORDER — DULERA 200-5 MCG/ACT IN AERO: 2.0000 | INHALATION_SPRAY | Freq: Two times a day (BID) | RESPIRATORY_TRACT | 5 refills | Status: DC

## 2019-12-20 NOTE — Progress Notes (Signed)
Virtual Visit via Telephone Note  I connected with Rogue Jury on 12/20/19 at 10:50 AM EDT by telephone and verified that I am speaking with the correct person using two identifiers.   I discussed the limitations, risks, security and privacy concerns of performing an evaluation and management service by telephone and the availability of in person appointments. I also discussed with the patient that there may be a patient responsible charge related to this service. The patient expressed understanding and agreed to proceed.   History of Present Illness: Mckenzie [name] is having a virtual visit for asthma exacerbation she used her rescue inhaler but did not help much EMS was called gave her a breathing treatment felt better at the time this was Thursday 12/16/2019. She states her chests remain tight and difficult to breath ( this may be contributed to exposure to new chemicals at work) She was unable to work Friday- Saturday.     Past Medical History:  Diagnosis Date  . Asthma   . Eczema    Current Outpatient Medications on File Prior to Visit  Medication Sig Dispense Refill  . cetirizine (ZYRTEC) 10 MG tablet Take 1 tablet (10 mg total) by mouth daily. 30 tablet 5  . Fluticasone Propionate (XHANCE) 93 MCG/ACT EXHU Place 2 sprays into the nose 2 (two) times daily. 32 mL 5  . montelukast (SINGULAIR) 10 MG tablet Take 1 tablet (10 mg total) by mouth at bedtime. 30 tablet 5  . norgestimate-ethinyl estradiol (ORTHO-CYCLEN) 0.25-35 MG-MCG tablet Take 1 tablet by mouth daily. 1 Package 11  . olopatadine (PATANOL) 0.1 % ophthalmic solution Place 1 drop into both eyes 2 (two) times daily as needed for allergies (itchy/watery eyes). 5 mL 5  . Olopatadine HCl (PAZEO) 0.7 % SOLN Place 1 drop into both eyes daily as needed. 2.5 mL 5   No current facility-administered medications on file prior to visit.   Observations/Objective: Review of Systems  Respiratory: Positive for shortness of breath and  wheezing.   Cardiovascular:       Tight feeling chest     Assessment and Plan: Diagnoses and all orders for this visit:  Moderate persistent asthma without complication Patient is being managed for asthma by Dr. Wyline Mood DO. Refilled her medication and advised her to contact her office for work excuse and follow up on medication if adjustments need to be made.  Other orders -     albuterol (VENTOLIN HFA) 108 (90 Base) MCG/ACT inhaler; Inhale 2 puffs into the lungs every 6 (six) hours as needed for wheezing or shortness of breath. -     mometasone-formoterol (DULERA) 200-5 MCG/ACT AERO; Inhale 2 puffs into the lungs 2 (two) times daily.    Follow Up Instructions:    I discussed the assessment and treatment plan with the patient. The patient was provided an opportunity to ask questions and all were answered. The patient agreed with the plan and demonstrated an understanding of the instructions.   The patient was advised to call back or seek an in-person evaluation if the symptoms worsen or if the condition fails to improve as anticipated.  I provided 10 minutes of non-face-to-face time during this encounter.   Grayce Sessions, NP

## 2020-01-05 ENCOUNTER — Other Ambulatory Visit: Payer: Self-pay

## 2020-01-05 ENCOUNTER — Encounter: Payer: Self-pay | Admitting: Allergy

## 2020-01-05 ENCOUNTER — Ambulatory Visit (INDEPENDENT_AMBULATORY_CARE_PROVIDER_SITE_OTHER): Payer: Medicaid Other | Admitting: Allergy

## 2020-01-05 VITALS — BP 100/62 | HR 65 | Temp 97.5°F | Resp 18 | Ht 66.0 in | Wt 269.8 lb

## 2020-01-05 DIAGNOSIS — Z7189 Other specified counseling: Secondary | ICD-10-CM | POA: Diagnosis not present

## 2020-01-05 DIAGNOSIS — J4540 Moderate persistent asthma, uncomplicated: Secondary | ICD-10-CM | POA: Diagnosis not present

## 2020-01-05 DIAGNOSIS — H1013 Acute atopic conjunctivitis, bilateral: Secondary | ICD-10-CM

## 2020-01-05 DIAGNOSIS — Z7185 Encounter for immunization safety counseling: Secondary | ICD-10-CM | POA: Insufficient documentation

## 2020-01-05 DIAGNOSIS — J3089 Other allergic rhinitis: Secondary | ICD-10-CM

## 2020-01-05 MED ORDER — MONTELUKAST SODIUM 10 MG PO TABS: 10.0000 mg | ORAL_TABLET | Freq: Every day | ORAL | 5 refills | Status: AC

## 2020-01-05 MED ORDER — ALBUTEROL SULFATE HFA 108 (90 BASE) MCG/ACT IN AERS: 2.0000 | INHALATION_SPRAY | RESPIRATORY_TRACT | 1 refills | Status: AC | PRN

## 2020-01-05 NOTE — Assessment & Plan Note (Signed)
Past history - Perennial rhinoconjunctivitis symptoms for the past unknown years but worse the last year. 2019 Immunocap was positive to mold and dust mites. 2019 skin testing was positive to molds and cockroaches.  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 Interim history - stable. Some blood tinged nasal discharge.   Continueenvironmental control measures.  ContinueSingulair10mg  daily.  Take Zyrtec (cetirizine) 10mg  daily.  Decrease Xhance to one spray per nostril 2 times a day.  May use Patanol 0.1% 1 drop per eye twice a day as needed for itch/watery eyes.

## 2020-01-05 NOTE — Assessment & Plan Note (Addendum)
Past history - Respiratory symptoms for unknown years but worse the last year which seems to flare with her allergies and exertion.   Interim history - no significant improvement with the Dulera 200 dose.  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  EMS was called and nebulizer treatment given.  No oral steroids since her last visit.  Wheezing with exertion.  Today's spirometry was normal.  ACT score 19.  Daily controller medication(s):Decrease to Dulera 100 2 puffs twice a day with spacer and rinse mouth afterwards.  If you notice worsening symptoms with the 100 dose then go back up to the 200 dose 2 puffs twice a day.   Prior to physical activity:May use albuterol rescue inhaler 2 puffs 5 to 15 minutes prior to strenuous physical activities.  Rescue medications:May use albuterol rescue inhaler 2 puffs or nebulizer every 4 to 6 hours as needed for shortness of breath, chest tightness, coughing, and wheezing. Monitor frequency of use.

## 2020-01-05 NOTE — Assessment & Plan Note (Signed)
Discussed the risks and benefits of COVID-19 vaccination. No history of anaphylactic or severe reactions to vaccines/medications in the past. Patient does not get annual flu shots.   Patient has no contraindications in her medical history.   Recommend getting the COVID-19 vaccine when available.

## 2020-01-05 NOTE — Patient Instructions (Addendum)
Moderate persistent asthma  Daily controller medication(s):Decrease to Dulera 100 2 puffs twice a day with spacer and rinse mouth afterwards.  If you notice worsening symptoms with the 100 dose then go back up to the 200 dose 2 puffs twice a day.   Prior to physical activity:May use albuterol rescue inhaler 2 puffs 5 to 15 minutes prior to strenuous physical activities.  Rescue medications:May use albuterol rescue inhaler 2 puffs or nebulizer every 4 to 6 hours as needed for shortness of breath, chest tightness, coughing, and wheezing. Monitor frequency of use. Asthma control goals:  Full participation in all desired activities (may need albuterol before activity) Albuterol use two times or less a week on average (not counting use with activity) Cough interfering with sleep two times or less a month Oral steroids no more than once a year No hospitalizations  Other allergic rhinitis Past testing positive to mold and dust mites, cockroaches.    Continueenvironmental control measures.  ContinueSingulair10mg  daily.  Take Zyrtec (cetirizine) 10mg  daily.  Decrease Xhance to one spray per nostril 2 times a day.  May use Patanol 0.1% 1 drop per eye twice a day as needed for itch/watery eyes.  Follow up in 4 months or sooner if needed.

## 2020-01-05 NOTE — Assessment & Plan Note (Signed)
   See assessment and plan as above for allergic rhinitis.  

## 2020-01-05 NOTE — Progress Notes (Addendum)
Follow Up Note  RE: Mckenzie [name] MRN: [id number] DOB: [birth year] Date of Office Visit: 01/05/2020  Referring provider: Grayce Sessions, NP Primary care provider: Grayce Sessions, NP  Chief Complaint: Follow-up and Asthma (attack 2wks ago)  History of Present Illness: I had the pleasure of seeing Mckenzie [name] for a follow up visit at the [facility] on 01/05/2020. She is a 34 y.o. female, who is being followed for asthma, allergic rhinitis. Her previous allergy office visit was on 08/02/2019 with Dr. Selena Batten. Today is a regular follow up visit.  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 She started to have chest tightness which led to an anxiety attack. EMS was called and was treated with nebulizer treatment with good benefit.  Currently on Dulera 200 2 puffs twice a day and did not notice any improvement over the 100 dose.   Patient has been using albuterol 1-2 times a day due to wheezing during exercise.  Otherwise using albuterol 1-2 times a week.  Allergic rhino conjunctivitis Currently on Singulair daily, zyrtec daily.  Some blood tinged nasal mucosa with Xhance 2 sprays per nostril BID with some benefit. Using eye drops as needed a few times with good benefit.   Covid-19 vaccine Patient is hesitant about getting it.   Assessment and Plan: Mckenzie [name] is a 34 y.o. female with: Moderate persistent asthma without complication Past history - Respiratory symptoms for unknown years but worse the last year which seems to flare with her allergies and exertion.   Interim history - no significant improvement with the Dulera 200 dose.  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  EMS was called and nebulizer treatment given.  No oral steroids since her last visit.  Wheezing with exertion.  Today's spirometry was normal.  ACT score 19.  Daily controller  medication(s):Decrease to Dulera 100 2 puffs twice a day with spacer and rinse mouth afterwards.  If you notice worsening symptoms with the 100 dose then go back up to the 200 dose 2 puffs twice a day.   Prior to physical activity:May use albuterol rescue inhaler 2 puffs 5 to 15 minutes prior to strenuous physical activities.  Rescue medications:May use albuterol rescue inhaler 2 puffs or nebulizer every 4 to 6 hours as needed for shortness of breath, chest tightness, coughing, and wheezing. Monitor frequency of use.  Other allergic rhinitis Past history - Perennial rhinoconjunctivitis symptoms for the past unknown years but worse the last year. 2019 Immunocap was positive to mold and dust mites. 2019 skin testing was positive to molds and cockroaches.  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 Interim history - stable. Some blood tinged nasal discharge.   Continueenvironmental control measures.  ContinueSingulair10mg  daily.  Take Zyrtec (cetirizine) 10mg  daily.  Decrease Xhance to one spray per nostril 2 times a day.  May use Patanol 0.1% 1 drop per eye twice a day as needed for itch/watery eyes.  Allergic conjunctivitis of both eyes  See assessment and plan as above for allergic rhinitis.   Vaccine counseling Discussed the risks and benefits of COVID-19 vaccination. No history of anaphylactic or severe reactions to vaccines/medications in the past. Patient does not get annual flu shots.   Patient has no contraindications in her medical history.   Recommend getting the COVID-19 vaccine when available.   Return in about 4 months (around 05/06/2020).  Meds ordered this encounter  Medications  . albuterol (VENTOLIN HFA) 108 (90 Base) MCG/ACT inhaler    Sig: Inhale 2 puffs into the lungs every 4 (four) hours as needed for wheezing or shortness of breath (use 2 puffs 5 min before exercise to preven twheezing).    Dispense:  18 g    Refill:  1  . montelukast  (SINGULAIR) 10 MG tablet    Sig: Take 1 tablet (10 mg total) by mouth at bedtime.    Dispense:  30 tablet    Refill:  5   Diagnostics: Spirometry:  Tracings reviewed. Her effort: Good reproducible efforts. FVC: 3.52L FEV1: 2.71L, 95% predicted FEV1/FVC ratio: 77% Interpretation: Spirometry consistent with normal pattern.  Please see scanned spirometry results for details.   Medication List:  Current Outpatient Medications  Medication Sig Dispense Refill  . albuterol (VENTOLIN HFA) 108 (90 Base) MCG/ACT inhaler Inhale 2 puffs into the lungs every 4 (four) hours as needed for wheezing or shortness of breath (use 2 puffs 5 min before exercise to preven twheezing). 18 g 1  . cetirizine (ZYRTEC) 10 MG tablet Take 1 tablet (10 mg total) by mouth daily. 30 tablet 5  . Fluticasone Propionate (XHANCE) 93 MCG/ACT EXHU Place 2 sprays into the nose 2 (two) times daily. 32 mL 5  . mometasone-formoterol (DULERA) 200-5 MCG/ACT AERO Inhale 2 puffs into the lungs 2 (two) times daily. 13 g 5  . montelukast (SINGULAIR) 10 MG tablet Take 1 tablet (10 mg total) by mouth at bedtime. 30 tablet 5  . norgestimate-ethinyl estradiol (ORTHO-CYCLEN) 0.25-35 MG-MCG tablet Take 1 tablet by mouth daily. 1 Package 11  . olopatadine (PATANOL) 0.1 % ophthalmic solution Place 1 drop into both eyes 2 (two) times daily as needed for allergies (itchy/watery eyes). 5 mL 5  . Olopatadine HCl (PAZEO) 0.7 % SOLN Place 1 drop into both eyes daily as needed. 2.5 mL 5   No current facility-administered medications for this visit.   Allergies: No Known Allergies I reviewed her past medical history, social history, family history, and environmental history and no significant changes have been reported from her previous visit.  Review of Systems  Constitutional: Negative for appetite change, chills, fever and unexpected weight change.  HENT: Positive for congestion. Negative for rhinorrhea.   Eyes: Positive for itching.   Respiratory: Positive for wheezing. Negative for cough, chest tightness and shortness of breath.   Cardiovascular: Negative for chest pain.  Gastrointestinal: Negative for abdominal pain.  Genitourinary: Negative for difficulty urinating.  Skin: Negative for rash.  Allergic/Immunologic: Positive for environmental allergies. Negative for food allergies.  Neurological: Negative for headaches.   Objective: BP 100/62 (BP Location: Right Arm, Patient Position: Sitting, Cuff Size: Large)   Pulse 65   Temp (!) 97.5 F (36.4 C) (Temporal)   Resp 18   Ht 5\' 6"  (1.676 m)   Wt 269 lb 12.8 oz (122.4 kg)   SpO2 97%   BMI 43.55 kg/m  Body mass index is 43.55 kg/m. Physical Exam  Constitutional: She is oriented to person, place, and time. She appears well-developed and well-nourished.  HENT:  Head: Normocephalic and atraumatic.  Right Ear: External ear normal.  Left Ear: External ear normal.  Mouth/Throat: Oropharynx is clear and moist.  Eyes: Conjunctivae and EOM are normal.  Cardiovascular: Normal rate, regular rhythm and normal heart sounds. Exam reveals no gallop and no friction rub.  No murmur heard. Pulmonary/Chest: Effort normal and breath sounds normal. She has no wheezes. She has no rales.  Abdominal:  Soft.  Musculoskeletal:     Cervical back: Neck supple.  Neurological: She is alert and oriented to person, place, and time.  Skin: Skin is warm. No rash noted.  Psychiatric: She has a normal mood and affect. Her behavior is normal.  Nursing note and vitals reviewed.  Previous notes and tests were reviewed. The plan was reviewed with the patient/family, and all questions/concerned were addressed.  It was my pleasure to see Mckenzie [name] today and participate in her care. Please feel free to contact me with any questions or concerns.  Sincerely,  Wyline Mood, DO Allergy & Immunology  [facility] office: [phone] [facility] office:  [phone] [place] office: [phone]

## 2020-02-08 ENCOUNTER — Emergency Department (HOSPITAL_COMMUNITY): Payer: Worker's Compensation

## 2020-02-08 ENCOUNTER — Other Ambulatory Visit: Payer: Self-pay

## 2020-02-08 ENCOUNTER — Encounter (HOSPITAL_COMMUNITY): Payer: Self-pay | Admitting: Emergency Medicine

## 2020-02-08 ENCOUNTER — Emergency Department (HOSPITAL_COMMUNITY)
Admission: EM | Admit: 2020-02-08 | Discharge: 2020-02-09 | Disposition: A | Discharge status: Home / Self Care | Payer: Worker's Compensation | Attending: Emergency Medicine | Admitting: Emergency Medicine

## 2020-02-08 DIAGNOSIS — Z793 Long term (current) use of hormonal contraceptives: Secondary | ICD-10-CM | POA: Diagnosis not present

## 2020-02-08 DIAGNOSIS — R29898 Other symptoms and signs involving the musculoskeletal system: Secondary | ICD-10-CM

## 2020-02-08 DIAGNOSIS — M25521 Pain in right elbow: Secondary | ICD-10-CM | POA: Diagnosis not present

## 2020-02-08 DIAGNOSIS — Z79899 Other long term (current) drug therapy: Secondary | ICD-10-CM | POA: Diagnosis not present

## 2020-02-08 DIAGNOSIS — M6281 Muscle weakness (generalized): Secondary | ICD-10-CM | POA: Diagnosis not present

## 2020-02-08 DIAGNOSIS — J45909 Unspecified asthma, uncomplicated: Secondary | ICD-10-CM | POA: Insufficient documentation

## 2020-02-08 NOTE — ED Provider Notes (Signed)
[facility] EMERGENCY DEPARTMENT Provider Note   CSN: [id number] Arrival date & time: 02/08/20  2159     History Chief Complaint  Patient presents with  . Arm Injury    Mckenzie [name] is a 34 y.o. female who presents today for evaluation of a right elbow injury.  She is right-hand dominant.  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  She reports that since then she has had worsening pain in her elbow.  She reports weakness in her grip strength.  She denies any changes to sensation or true numbness.  No aggravating or alleviating factors noted.  She denies dropping objects.  She denies any fevers or wounds.  HPI     Past Medical History:  Diagnosis Date  . Asthma   . Eczema     Patient Active Problem List   Diagnosis Date Noted  . Vaccine counseling 01/05/2020  . Allergic conjunctivitis of both eyes 08/02/2019  . Moderate persistent asthma without complication 04/28/2019  . Other allergic rhinitis 09/16/2018  . Other atopic dermatitis 09/16/2018    History reviewed. No pertinent surgical history.   OB History    Gravida  4   Para  4   Term  4   Preterm      AB      Living  4     SAB      TAB      Ectopic      Multiple      Live Births              No family history on file.  Social History   Tobacco Use  . Smoking status: Never Smoker  . Smokeless tobacco: Never Used  Substance Use Topics  . Alcohol use: Never  . Drug use: Never    Home Medications Prior to Admission medications   Medication Sig Start Date End Date Taking? Authorizing Provider  albuterol (VENTOLIN HFA) 108 (90 Base) MCG/ACT inhaler Inhale 2 puffs into the lungs every 4 (four) hours as needed for wheezing or shortness of breath (use 2 puffs 5 min before exercise to preven twheezing). 01/05/20   Ellamae Sia,  DO  cetirizine (ZYRTEC) 10 MG tablet Take 1 tablet (10 mg total) by mouth daily. 08/02/19   Ellamae Sia, DO  Fluticasone Propionate Timmothy Sours) 93 MCG/ACT EXHU Place 2 sprays into the nose 2 (two) times daily. 07/02/19   Ellamae Sia, DO  mometasone-formoterol (DULERA) 200-5 MCG/ACT AERO Inhale 2 puffs into the lungs 2 (two) times daily. 12/20/19   Grayce Sessions, NP  montelukast (SINGULAIR) 10 MG tablet Take 1 tablet (10 mg total) by mouth at bedtime. 01/05/20   Ellamae Sia, DO  norgestimate-ethinyl estradiol (ORTHO-CYCLEN) 0.25-35 MG-MCG tablet Take 1 tablet by mouth daily. 05/12/19   Marny Lowenstein, PA-C  olopatadine (PATANOL) 0.1 % ophthalmic solution Place 1 drop into both eyes 2 (two) times daily as needed for allergies (itchy/watery eyes). 08/02/19   Ellamae Sia, DO  Olopatadine HCl (PAZEO) 0.7 % SOLN Place 1 drop into both eyes daily as needed. 08/04/19   Ellamae Sia, DO    Allergies    Patient has no known allergies.  Review of Systems   Review of Systems  Constitutional: Negative for chills and fever.  Musculoskeletal: Negative for back  pain and neck pain.  Skin: Negative for color change, rash and wound.  Neurological: Positive for weakness. Negative for numbness and headaches.  All other systems reviewed and are negative.   Physical Exam Updated Vital Signs BP 116/75 (BP Location: Left Arm)   Pulse 65   Temp 98.3 F (36.8 C) (Oral)   Resp 16   Ht 5\' 6"  (1.676 m)   Wt 130 kg   LMP 02/03/2020   SpO2 100%   BMI 46.26 kg/m   Physical Exam Vitals and nursing note reviewed.  Constitutional:      General: She is not in acute distress.    Appearance: She is well-developed. She is not diaphoretic.  HENT:     Head: Normocephalic and atraumatic.  Eyes:     General: No scleral icterus.       Right eye: No discharge.        Left eye: No discharge.     Conjunctiva/sclera: Conjunctivae normal.  Cardiovascular:     Rate and Rhythm: Normal rate and regular rhythm.      Pulses: Normal pulses.     Comments: 2+ right radial pulse Pulmonary:     Effort: Pulmonary effort is normal. No respiratory distress.     Breath sounds: No stridor.  Abdominal:     General: There is no distension.  Musculoskeletal:        General: No deformity.     Cervical back: Normal range of motion.     Comments: Mild TTP over the elbow, primarily on the medial aspect. No elbow contusion, or edema.  Full AROM with out clicking/crepitis palpated.   Skin:    General: Skin is warm and dry.  Neurological:     Mental Status: She is alert.     Cranial Nerves: No cranial nerve deficit.     Sensory: No sensory deficit.     Motor: Weakness present. No abnormal muscle tone.     Coordination: Coordination normal.     Comments: Sensation intact to light touch to right upper extremity. Patient has 4/5 grip strength. She is unable to make an okay symbol with her hand as she is unable to fully bring her thumb and pointer finger together.  She has 4/5 strength with wrist flexion, extension.  Finger abduction strength is 5/5. Individual fingers are tested for flexion strength strength grossly appears intact.  Decreased finger extension strength. Strength is intact for pronation, supination, elbow flexion, extension shoulder abduction, abduction. Patient is noted to be able to sign her name with a pen during registration and hold the pen without apparent difficulty.  She is able to control the position of her wrist and hand a in space with out it being limp or with out apparent difficulty.   Psychiatric:        Behavior: Behavior normal.     ED Results / Procedures / Treatments   Labs (all labs ordered are listed, but only abnormal results are displayed) Labs Reviewed - No data to display  EKG None  Radiology DG Elbow Complete Right  Result Date: 02/08/2020 CLINICAL DATA:  Right elbow injury, pain with movement EXAM: RIGHT ELBOW - COMPLETE 3+ VIEW COMPARISON:  None. FINDINGS: Frontal,  bilateral oblique, lateral views of the right elbow demonstrate no acute displaced fracture. Alignment is anatomic. No joint effusion. IMPRESSION: 1. Unremarkable right elbow. Electronically Signed   By: Randa Ngo M.D.   On: 02/08/2020 22:36    Procedures Procedures (including critical care time)  Medications  Ordered in ED Medications - No data to display  ED Course  I have reviewed the triage vital signs and the nursing notes.  Pertinent labs & imaging results that were available during my care of the patient were reviewed by me and considered in my medical decision making (see chart for details).    MDM Rules/Calculators/A&P                     Patient is a 34 year old woman who presents today for evaluation of right arm injury.  On exam she has some element of decreased strength in her right hand however had no sensory changes and weakness was limited to hand and wrist.  X-rays were obtained without evidence of fracture or other acute abnormalities of the elbow.  She does not have specific pain in the right hand or wrist.  No changes to sensation.  No pain in her neck or shoulder. Given that her symptoms would involve dysfunction of multiple nerves I  spoke with on call Dr. Amanda Pea for hand.  Recommends ICE, conservative care, OTC pain medication and follow up in a week if symptoms not resolved.   Sling given.    OTC pain medication.   Return precautions were discussed with patient who states their understanding.  At the time of discharge patient denied any unaddressed complaints or concerns.  Patient is agreeable for discharge home.  Note: Portions of this report may have been transcribed using voice recognition software. Every effort was made to ensure accuracy; however, inadvertent computerized transcription errors may be present  Final Clinical Impression(s) / ED Diagnoses Final diagnoses:  Right elbow pain  Right hand weakness    Rx / DC Orders ED Discharge Orders     None       Cristina Gong, PA-C 02/09/20 [id number]    Tegeler, Canary Brim, MD 02/09/20 1626

## 2020-02-08 NOTE — ED Triage Notes (Signed)
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 No deformity or swelling .

## 2020-02-08 NOTE — Discharge Instructions (Addendum)

## 2020-02-15 ENCOUNTER — Ambulatory Visit (INDEPENDENT_AMBULATORY_CARE_PROVIDER_SITE_OTHER): Payer: Medicaid Other | Admitting: Primary Care

## 2020-02-16 ENCOUNTER — Telehealth (INDEPENDENT_AMBULATORY_CARE_PROVIDER_SITE_OTHER): Payer: Medicaid Other | Admitting: Primary Care

## 2020-04-19 ENCOUNTER — Encounter: Payer: Self-pay | Admitting: General Practice

## 2020-04-26 ENCOUNTER — Encounter: Payer: Self-pay | Admitting: Allergy

## 2020-04-26 ENCOUNTER — Ambulatory Visit (INDEPENDENT_AMBULATORY_CARE_PROVIDER_SITE_OTHER): Payer: Medicaid Other | Admitting: Allergy

## 2020-04-26 ENCOUNTER — Other Ambulatory Visit: Payer: Self-pay

## 2020-04-26 VITALS — BP 122/80 | HR 67 | Temp 98.5°F | Ht 66.0 in | Wt 277.6 lb

## 2020-04-26 DIAGNOSIS — J3089 Other allergic rhinitis: Secondary | ICD-10-CM

## 2020-04-26 DIAGNOSIS — H1013 Acute atopic conjunctivitis, bilateral: Secondary | ICD-10-CM | POA: Diagnosis not present

## 2020-04-26 DIAGNOSIS — J4540 Moderate persistent asthma, uncomplicated: Secondary | ICD-10-CM | POA: Diagnosis not present

## 2020-04-26 DIAGNOSIS — Z7185 Encounter for immunization safety counseling: Secondary | ICD-10-CM

## 2020-04-26 DIAGNOSIS — Z7189 Other specified counseling: Secondary | ICD-10-CM | POA: Diagnosis not present

## 2020-04-26 MED ORDER — SPIRIVA RESPIMAT 1.25 MCG/ACT IN AERS
2.0000 | INHALATION_SPRAY | Freq: Every day | RESPIRATORY_TRACT | 5 refills | Status: AC
Start: 2020-04-26 — End: ?

## 2020-04-26 MED ORDER — OLOPATADINE HCL 0.2 % OP SOLN: 1.0000 [drp] | Freq: Every day | OPHTHALMIC | 5 refills | Status: AC | PRN

## 2020-04-26 NOTE — Assessment & Plan Note (Signed)
Discussed the risks and benefits of COVID-19 vaccination. No history of anaphylactic or severe reactions to vaccines/medications in the past. 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   Recommend getting the COVID-19 vaccine when available.

## 2020-04-26 NOTE — Assessment & Plan Note (Signed)
   See assessment and plan as above for allergic rhinitis.  

## 2020-04-26 NOTE — Patient Instructions (Addendum)
Moderate persistent asthma  Daily controller medication(s):continue Dulera 200 2 puffs twice a day with spacer and rinse mouth afterwards.  Start Spiriva 1.29mcg 2 puffs once a day. Demonstrated proper use. Sample given.  Prior to physical activity:May use albuterol rescue inhaler 2 puffs 5 to 15 minutes prior to strenuous physical activities.  Rescue medications:May use albuterol rescue inhaler 2 puffs or nebulizer every 4 to 6 hours as needed for shortness of breath, chest tightness, coughing, and wheezing. Monitor frequency of use. Asthma control goals:  Full participation in all desired activities (may need albuterol before activity) Albuterol use two times or less a week on average (not counting use with activity) Cough interfering with sleep two times or less a month Oral steroids no more than once a year No hospitalizations  Other allergic rhinitis Past testing positive to mold and dust mites, cockroaches.    Continueenvironmental control measures.  ContinueSingulair10mg  daily.  Take Zyrtec (cetirizine) 10mg  daily. May take twice a day if needed during allergy flares.   Decrease Xhance to one spray per nostril 2 times a day.  May use olopatadine 0.2% 1 drop in each eye daily as needed for itchy/watery eyes.   Read about allergy injections.  Follow up in 3 months or sooner if needed.   Recommend COVID-19 vaccination.

## 2020-04-26 NOTE — Progress Notes (Signed)
Follow Up Note  RE: Mckenzie [name] MRN: [id number] DOB: 1986/07/30 Date of Office Visit: 04/26/2020  Referring provider: Grayce Sessions, NP Primary care provider: Grayce Sessions, NP  Chief Complaint: Asthma (everything is going well. Meds working well)  History of Present Illness: I had the pleasure of seeing Mckenzie [name] for a follow up visit at the [facility] on 04/26/2020. She is a 34 y.o. female, who is being followed for asthma, allergic rhinoconjunctivitis. Her previous allergy office visit was on 01/05/2020 with Dr. Selena Batten. Today is a regular follow up visit.  Moderate persistent asthma Patient has been having issues with wheezing, and shortness of breath for the past 2 months with the increased pollen in the air.   Currently on Dulera 2 puffs twice a day and she never tried the [facility] as when she went to pick up her inhaler they gave her the dose and cancelled the dose.  Used albuterol a few times since last visit with good benefit.  Denies any ER/urgent care visits or prednisone use since the last visit.  Asthma usually flares during the high pollen season and if exposed to strong cleaning products.  Allergic rhino conjunctivitis Usually has issues with PND and itchy eyes. Currently on Singulair daily, zyrtec 10mg  daily, Xhance 1 spray BID - no nosebleeds.     Vaccine counseling Did not get vaccine yet and hesitant. She does work at a nursing home.   Assessment and Plan: Mckenzie [name] is a 34 y.o. female with: Moderate persistent asthma without complication Past history - Respiratory symptoms for unknown years but worse the last year which seems to flare with her allergies and exertion.   Interim history - noticing some wheezing and shortness of breath for 2 months. Did not lower Dulera dose to 31.  Today's spirometry was normal but slightly worse than previous.  ACT score 18.  Daily controller  medication(s):continue Dulera 2 puffs twice a day with spacer and rinse mouth afterwards.  Start Spiriva 1.62mcg 2 puffs once a day. Demonstrated proper use. Sample given.  Prior to physical activity:May use albuterol rescue inhaler 2 puffs 5 to 15 minutes prior to strenuous physical activities.  Rescue medications:May use albuterol rescue inhaler 2 puffs or nebulizer every 4 to 6 hours as needed for shortness of breath, chest tightness, coughing, and wheezing. Monitor frequency of use.  Repeat spirometry at next visit.  Other allergic rhinitis Past history - Perennial rhinoconjunctivitis symptoms for the past unknown years but worse the last year. 2019 Immunocap was positive to mold and dust mites. 2019 skin testing was positive to molds and cockroaches.  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 Interim history - stable. No nosebleeds with Xhance 1 spray BID.  Continueenvironmental control measures.  ContinueSingulair10mg  daily.  Take Zyrtec (cetirizine) 10mg  daily. May take twice a day if needed during allergy flares.   Continue Xhance one spray per nostril 2 times a day.  May use olopatadine 0.2% 1 drop in each eye daily as needed for itchy/watery eyes.   Read about allergy injections.  Allergic conjunctivitis of both eyes  See assessment and plan as above for allergic rhinitis.   Vaccine counseling Discussed the risks and benefits of COVID-19 vaccination. No history of anaphylactic or severe reactions to vaccines/medications in the past. 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   Recommend getting the COVID-19 vaccine when available.   Return  in about 3 months (around 07/27/2020).  Meds ordered this encounter  Medications  . Olopatadine HCl 0.2 % SOLN    Sig: Apply 1 drop to eye daily as needed (itchy/watery eyes).    Dispense:  2.5 mL    Refill:  5  . Tiotropium Bromide Monohydrate (SPIRIVA  RESPIMAT) 1.25 MCG/ACT AERS    Sig: Inhale 2 puffs into the lungs daily.    Dispense:  4 g    Refill:  5   Diagnostics: Spirometry:  Tracings reviewed. Her effort: It was hard to get consistent efforts and there is a question as to whether this reflects a maximal maneuver. FVC: 2.78L FEV1: 2.36L, 83% predicted FEV1/FVC ratio: 85% Interpretation: No overt abnormalities noted given today's efforts.  Please see scanned spirometry results for details.  Medication List:  Current Outpatient Medications  Medication Sig Dispense Refill  . albuterol (VENTOLIN HFA) 108 (90 Base) MCG/ACT inhaler Inhale 2 puffs into the lungs every 4 (four) hours as needed for wheezing or shortness of breath (use 2 puffs 5 min before exercise to preven twheezing). 18 g 1  . cetirizine (ZYRTEC) 10 MG tablet Take 1 tablet (10 mg total) by mouth daily. 30 tablet 5  . Fluticasone Propionate (XHANCE) 93 MCG/ACT EXHU Place 2 sprays into the nose 2 (two) times daily. 32 mL 5  . mometasone-formoterol (DULERA) 200-5 MCG/ACT AERO Inhale 2 puffs into the lungs 2 (two) times daily. 13 g 5  . montelukast (SINGULAIR) 10 MG tablet Take 1 tablet (10 mg total) by mouth at bedtime. 30 tablet 5  . norgestimate-ethinyl estradiol (ORTHO-CYCLEN) 0.25-35 MG-MCG tablet Take 1 tablet by mouth daily. 1 Package 11  . meloxicam (MOBIC) 15 MG tablet Take 15 mg by mouth daily.    . Olopatadine HCl 0.2 % SOLN Apply 1 drop to eye daily as needed (itchy/watery eyes). 2.5 mL 5  . Tiotropium Bromide Monohydrate (SPIRIVA RESPIMAT) 1.25 MCG/ACT AERS Inhale 2 puffs into the lungs daily. 4 g 5   No current facility-administered medications for this visit.   Allergies: No Known Allergies I reviewed her past medical history, social history, family history, and environmental history and no significant changes have been reported from her previous visit.  Review of Systems  Constitutional: Negative for appetite change, chills, fever and unexpected weight  change.  HENT: Positive for congestion. Negative for rhinorrhea.   Eyes: Positive for itching.  Respiratory: Positive for shortness of breath and wheezing. Negative for cough and chest tightness.   Cardiovascular: Negative for chest pain.  Gastrointestinal: Negative for abdominal pain.  Genitourinary: Negative for difficulty urinating.  Skin: Negative for rash.  Allergic/Immunologic: Positive for environmental allergies. Negative for food allergies.  Neurological: Negative for headaches.   Objective: BP 122/80   Pulse 67   Temp 98.5 F (36.9 C)   Ht 5\' 6"  (1.676 m)   Wt 277 lb 9.6 oz (125.9 kg)   SpO2 97%   BMI 44.81 kg/m  Body mass index is 44.81 kg/m. Physical Exam Vitals and nursing note reviewed.  Constitutional:      Appearance: She is well-developed.  HENT:     Head: Normocephalic and atraumatic.     Right Ear: Tympanic membrane and external ear normal.     Left Ear: Tympanic membrane and external ear normal.     Nose: Congestion (on left side) present. No rhinorrhea.     Mouth/Throat:     Mouth: Mucous membranes are moist.     Pharynx: Oropharynx is clear.  Eyes:     Conjunctiva/sclera: Conjunctivae normal.  Cardiovascular:     Rate and Rhythm: Normal rate and regular rhythm.     Heart sounds: Normal heart sounds. No murmur heard.  No friction rub. No gallop.   Pulmonary:     Effort: Pulmonary effort is normal.     Breath sounds: Normal breath sounds. No wheezing, rhonchi or rales.  Musculoskeletal:     Cervical back: Neck supple.  Skin:    General: Skin is warm.     Findings: No rash.  Neurological:     Mental Status: She is alert and oriented to person, place, and time.  Psychiatric:        Mood and Affect: Mood normal.        Behavior: Behavior normal.    Previous notes and tests were reviewed. The plan was reviewed with the patient/family, and all questions/concerned were addressed.  It was my pleasure to see Mckenzie [name] today and participate in her  care. Please feel free to contact me with any questions or concerns.  Sincerely,  Wyline Mood, DO Allergy & Immunology  [facility] office: [phone] [facility] office: [phone] [place] office: [phone]

## 2020-04-26 NOTE — Assessment & Plan Note (Signed)
Past history - Respiratory symptoms for unknown years but worse the last year which seems to flare with her allergies and exertion.   Interim history - noticing some wheezing and shortness of breath for 2 months. Did not lower Dulera dose to .  Today's spirometry was normal but slightly worse than previous.  ACT score 18.  Daily controller medication(s):continue Dulera 2 puffs twice a day with spacer and rinse mouth afterwards.  Start Spiriva 1.92mcg 2 puffs once a day. Demonstrated proper use. Sample given.  Prior to physical activity:May use albuterol rescue inhaler 2 puffs 5 to 15 minutes prior to strenuous physical activities.  Rescue medications:May use albuterol rescue inhaler 2 puffs or nebulizer every 4 to 6 hours as needed for shortness of breath, chest tightness, coughing, and wheezing. Monitor frequency of use.  Repeat spirometry at next visit.

## 2020-04-26 NOTE — Assessment & Plan Note (Signed)
Past history - Perennial rhinoconjunctivitis symptoms for the past unknown years but worse the last year. 2019 Immunocap was positive to mold and dust mites. 2019 skin testing was positive to molds and cockroaches.  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 Interim history - stable. No nosebleeds with Xhance 1 spray BID.  Continueenvironmental control measures.  ContinueSingulair10mg  daily.  Take Zyrtec (cetirizine) 10mg  daily. May take twice a day if needed during allergy flares.   Continue Xhance one spray per nostril 2 times a day.  May use olopatadine 0.2% 1 drop in each eye daily as needed for itchy/watery eyes.   Read about allergy injections.

## 2020-06-01 ENCOUNTER — Other Ambulatory Visit (INDEPENDENT_AMBULATORY_CARE_PROVIDER_SITE_OTHER): Payer: Self-pay | Admitting: Primary Care

## 2020-06-01 NOTE — Telephone Encounter (Signed)
Requested medication (s) are due for refill today: yes  Requested medication (s) are on the active medication list: yes  Last refill: 04/16/2020  Future visit scheduled: no  Notes to clinic: review for refill Script filled by different provider    Requested Prescriptions  Pending Prescriptions Disp Refills   cetirizine (ZYRTEC) 10 MG tablet [Pharmacy Med Name: Cetirizine HCl 10 MG Oral Tablet] 30 tablet 0    Sig: Take 1 tablet by mouth once daily      Ear, Nose, and Throat:  Antihistamines Passed - 06/01/2020  7:45 AM      Passed - Valid encounter within last 12 months    Recent Outpatient Visits           5 months ago Moderate persistent asthma without complication   [facility] Grayce Sessions, NP   6 months ago Encounter for weight management   [facility] Gwinda Passe P, NP   8 months ago Body mass index (BMI) of 35.0-35.9 in adult   [facility] Grayce Sessions, NP   9 months ago Morbid obesity (HCC)   [facility] Grayce Sessions, NP   10 months ago High risk medication use   [facility] Grayce Sessions, NP

## 2020-06-01 NOTE — Telephone Encounter (Signed)
Medication Refill - Medication: cetirizine (ZYRTEC) 10 MG tablet   Has the patient contacted their pharmacy? Yes.   (Agent: If no, request that the patient contact the pharmacy for the refill.) (Agent: If yes, when and what did the pharmacy advise?)  Preferred Pharmacy (with phone number or street name): [id number] - [place] ([place] - [address]  [address] [place] ([place]) [place] [address]  Phone:  [phone] Fax:  [phone]   Agent: Please be advised that RX refills may take up to 3 business days. We ask that you follow-up with your pharmacy.

## 2020-06-08 ENCOUNTER — Ambulatory Visit: Payer: Medicaid Other | Admitting: Obstetrics and Gynecology

## 2020-07-17 ENCOUNTER — Other Ambulatory Visit: Payer: Self-pay

## 2020-07-17 MED ORDER — XHANCE 93 MCG/ACT NA EXHU: 2.0000 | INHALANT_SUSPENSION | Freq: Two times a day (BID) | NASAL | 5 refills | Status: AC

## 2020-07-25 ENCOUNTER — Other Ambulatory Visit: Payer: Self-pay | Admitting: *Deleted

## 2020-09-05 ENCOUNTER — Other Ambulatory Visit: Payer: Self-pay | Admitting: Allergy

## 2020-10-04 ENCOUNTER — Ambulatory Visit: Payer: Medicaid Other | Admitting: Allergy

## 2020-10-04 NOTE — Progress Notes (Deleted)
Follow Up Note  RE: Mckenzie [name] MRN: [id number] DOB: [birth year] Date of Office Visit: 10/04/2020  Referring provider: Grayce Sessions, NP Primary care provider: Grayce Sessions, NP  Chief Complaint: No chief complaint on file.  History of Present Illness: I had the pleasure of seeing Mckenzie [name] for a follow up visit at the [facility] on 10/04/2020. She is a 34 y.o. female, who is being followed for asthma, allergic rhinoconjunctivitis. Her previous allergy office visit was on 04/26/2020 with Dr. Selena [name]. Today is a regular follow up visit.  Moderate persistent asthma without complication Past history - Respiratory symptoms for unknown years but worse the last year which seems to flare with her allergies and exertion.   Interim history - noticing some wheezing and shortness of breath for 2 months. Did not lower Dulera dose to .  Today's spirometry was normal but slightly worse than previous.  ACT score 18.  Daily controller medication(s):continue Dulera 2 puffs twice a day with spacer and rinse mouth afterwards. ? Start Spiriva 1.22mcg 2 puffs once a day. Demonstrated proper use. Sample given.  Prior to physical activity:May use albuterol rescue inhaler 2 puffs 5 to 15 minutes prior to strenuous physical activities.  Rescue medications:May use albuterol rescue inhaler 2 puffs or nebulizer every 4 to 6 hours as needed for shortness of breath, chest tightness, coughing, and wheezing. Monitor frequency of use.  Repeat spirometry at next visit.  Other allergic rhinitis Past history - Perennial rhinoconjunctivitis symptoms for the past unknown years but worse the last year. 2019 Immunocap was positive to mold and dust mites. 2019 skin testing was positive to molds and cockroaches.  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 Interim history - stable. No nosebleeds with Xhance 1 spray BID.  Continueenvironmental control  measures.  ContinueSingulair10mg  daily.  Take Zyrtec (cetirizine) 10mg  daily. May take twice a day if needed during allergy flares.   Continue Xhance one spray per nostril 2 times a day.  May use olopatadine 0.2% 1 drop in each eye daily as needed for itchy/watery eyes.   Read about allergy injections.  Allergic conjunctivitis of both eyes  See assessment and plan as above for allergic rhinitis.   Vaccine counseling Discussed the risks and benefits of COVID-19 vaccination. No history of anaphylactic or severe reactions to vaccines/medications in the past. 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   Recommend getting the COVID-19 vaccine when available.   Return in about 3 months (around 07/27/2020).  Assessment and Plan: Mckenzie [name] is a 34 y.o. female with: No problem-specific Assessment & Plan notes found for this encounter.  No follow-ups on file.  No orders of the defined types were placed in this encounter.  Lab Orders  No laboratory test(s) ordered today    Diagnostics: Spirometry:  Tracings reviewed. Her effort: {Blank single:19197::"Good reproducible efforts.","It was hard to get consistent efforts and there is a question as to whether this reflects a maximal maneuver.","Poor effort, data can not be interpreted."} FVC: ***L FEV1: ***L, ***% predicted FEV1/FVC ratio: ***% Interpretation: {Blank single:19197::"Spirometry consistent with mild obstructive disease","Spirometry consistent with moderate obstructive disease","Spirometry consistent with severe obstructive disease","Spirometry consistent with possible restrictive disease","Spirometry consistent with mixed obstructive and restrictive disease","Spirometry uninterpretable due to technique","Spirometry consistent with normal pattern","No overt abnormalities noted given today's efforts"}.  Please see scanned spirometry results for details.  Skin Testing: {Blank  single:19197::"Select foods","Environmental allergy panel","Environmental allergy panel and select  foods","Food allergy panel","None","Deferred due to recent antihistamines use"}. Positive test to: ***. Negative test to: ***.  Results discussed with patient/family.   Medication List:  Current Outpatient Medications  Medication Sig Dispense Refill  . albuterol (VENTOLIN HFA) 108 (90 Base) MCG/ACT inhaler Inhale 2 puffs into the lungs every 4 (four) hours as needed for wheezing or shortness of breath (use 2 puffs 5 min before exercise to preven twheezing). 18 g 1  . cetirizine (ZYRTEC) 10 MG tablet Take 1 tablet by mouth once daily 30 tablet 0  . DULERA 200-5 MCG/ACT AERO Inhale 2 puffs by mouth twice daily 13 g 0  . Fluticasone Propionate (XHANCE) 93 MCG/ACT EXHU Place 2 sprays into the nose 2 (two) times daily. 32 mL 5  . meloxicam (MOBIC) 15 MG tablet Take 15 mg by mouth daily.    . montelukast (SINGULAIR) 10 MG tablet Take 1 tablet (10 mg total) by mouth at bedtime. 30 tablet 5  . norgestimate-ethinyl estradiol (ORTHO-CYCLEN) 0.25-35 MG-MCG tablet Take 1 tablet by mouth daily. 1 Package 11  . Olopatadine HCl 0.2 % SOLN Apply 1 drop to eye daily as needed (itchy/watery eyes). 2.5 mL 5  . Tiotropium Bromide Monohydrate (SPIRIVA RESPIMAT) 1.25 MCG/ACT AERS Inhale 2 puffs into the lungs daily. 4 g 5   No current facility-administered medications for this visit.   Allergies: No Known Allergies I reviewed her past medical history, social history, family history, and environmental history and no significant changes have been reported from her previous visit.  Review of Systems  Constitutional: Negative for appetite change, chills, fever and unexpected weight change.  HENT: Positive for congestion. Negative for rhinorrhea.   Eyes: Positive for itching.  Respiratory: Positive for shortness of breath and wheezing. Negative for cough and chest tightness.   Cardiovascular: Negative for chest pain.   Gastrointestinal: Negative for abdominal pain.  Genitourinary: Negative for difficulty urinating.  Skin: Negative for rash.  Allergic/Immunologic: Positive for environmental allergies. Negative for food allergies.  Neurological: Negative for headaches.   Objective: There were no vitals taken for this visit. There is no height or weight on file to calculate BMI. Physical Exam Vitals and nursing note reviewed.  Constitutional:      Appearance: She is well-developed.  HENT:     Head: Normocephalic and atraumatic.     Right Ear: Tympanic membrane and external ear normal.     Left Ear: Tympanic membrane and external ear normal.     Nose: Congestion (on left side) present. No rhinorrhea.     Mouth/Throat:     Mouth: Mucous membranes are moist.     Pharynx: Oropharynx is clear.  Eyes:     Conjunctiva/sclera: Conjunctivae normal.  Cardiovascular:     Rate and Rhythm: Normal rate and regular rhythm.     Heart sounds: Normal heart sounds. No murmur heard. No friction rub. No gallop.   Pulmonary:     Effort: Pulmonary effort is normal.     Breath sounds: Normal breath sounds. No wheezing, rhonchi or rales.  Musculoskeletal:     Cervical back: Neck supple.  Skin:    General: Skin is warm.     Findings: No rash.  Neurological:     Mental Status: She is alert and oriented to person, place, and time.  Psychiatric:        Mood and Affect: Mood normal.        Behavior: Behavior normal.    Previous notes and tests were reviewed. The plan was reviewed  with the patient/family, and all questions/concerned were addressed.  It was my pleasure to see Tanesia today and participate in her care. Please feel free to contact me with any questions or concerns.  Sincerely,  Wyline Mood, DO Allergy & Immunology  [facility] office: [phone] [facility] office: [phone]

## 2021-01-21 IMAGING — CR DG ELBOW COMPLETE 3+V*R*
4 series · 4 of 4 positions shown · non-contrast
Comparison: None.

CLINICAL DATA: Right elbow injury, pain with movement

EXAM:
RIGHT ELBOW - COMPLETE 3+ VIEW

[elbow ap]
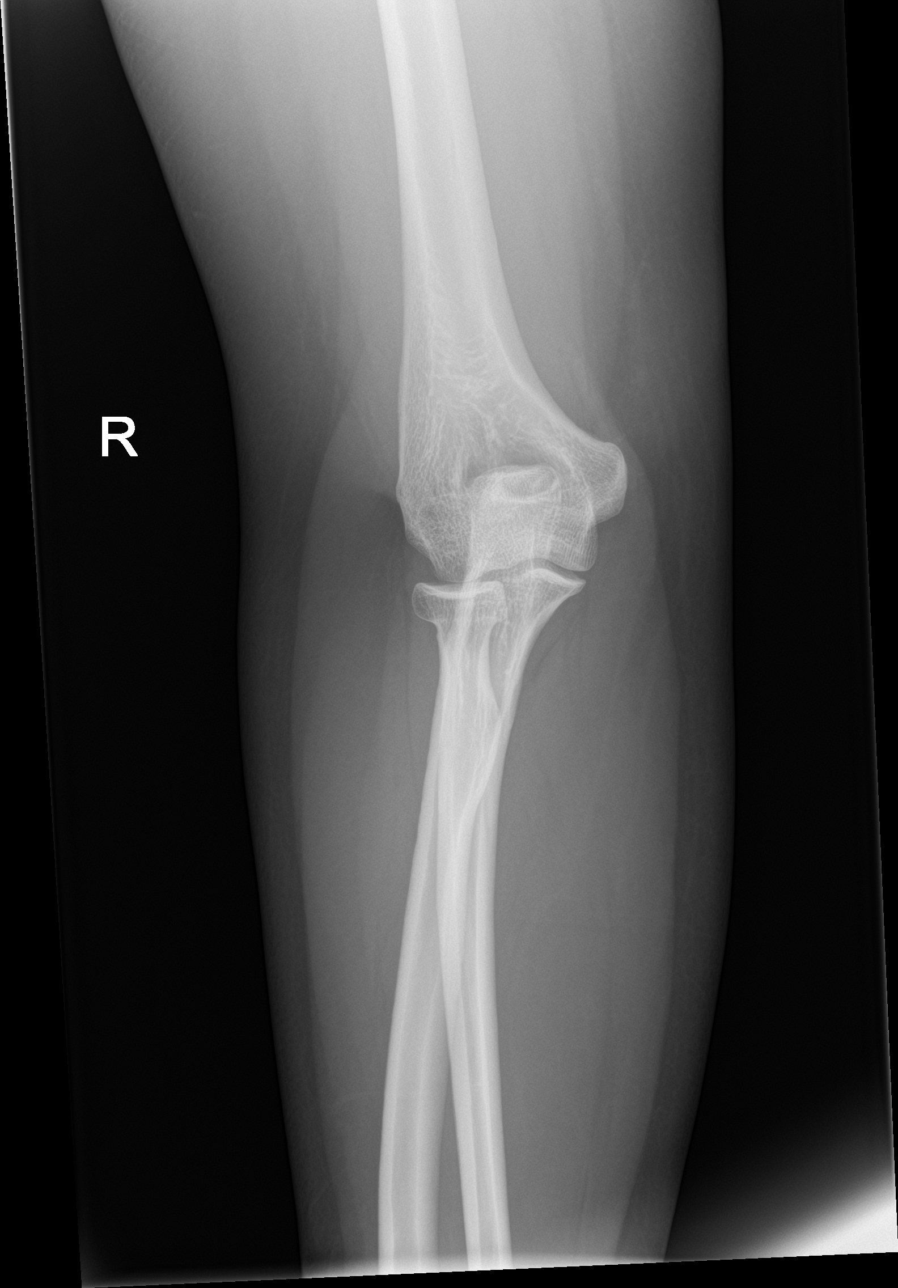

[elbow obl (1 of 2)]
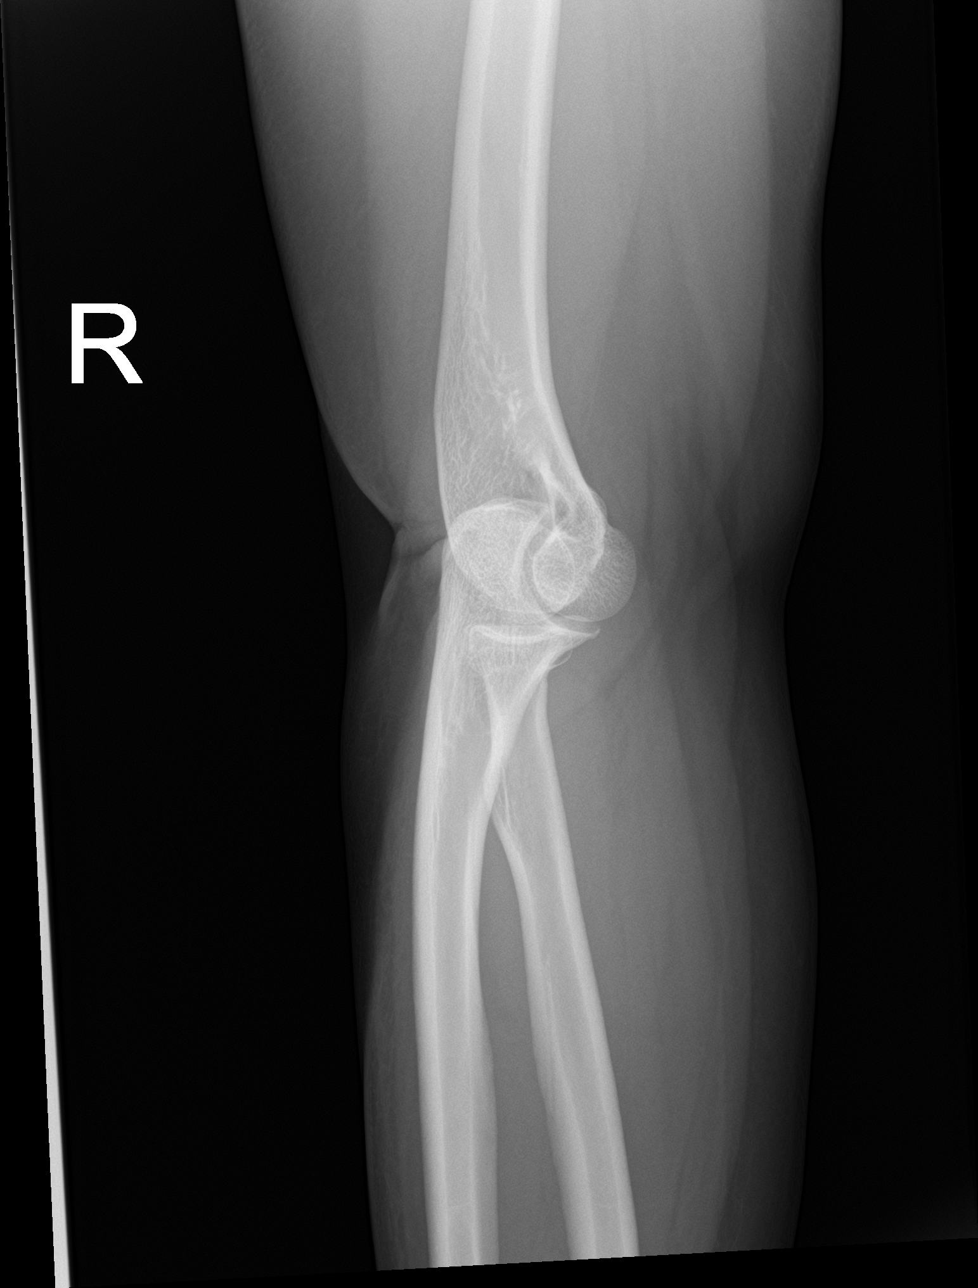

[elbow obl (2 of 2)]
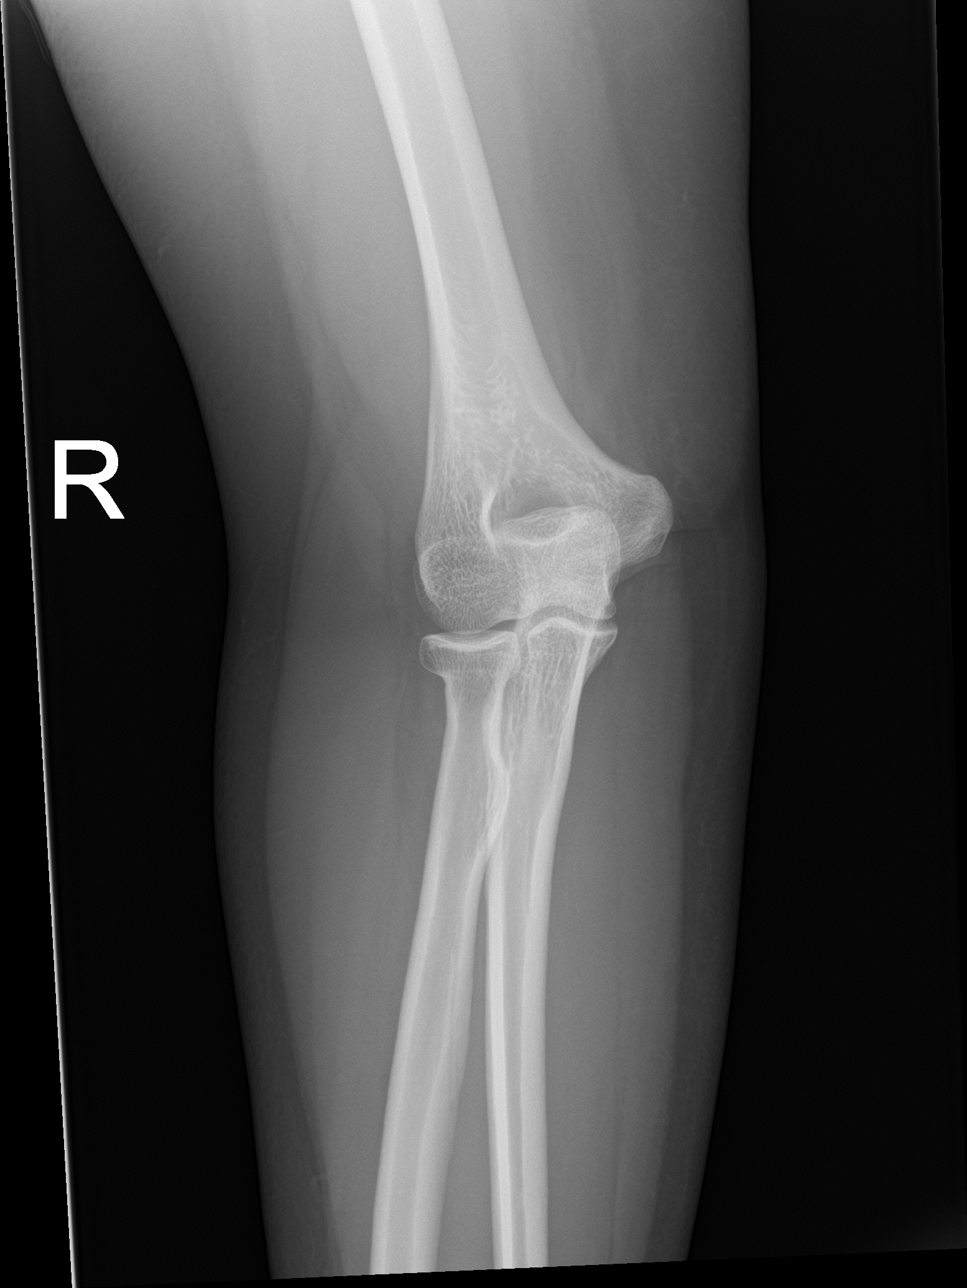

[elbow lat]
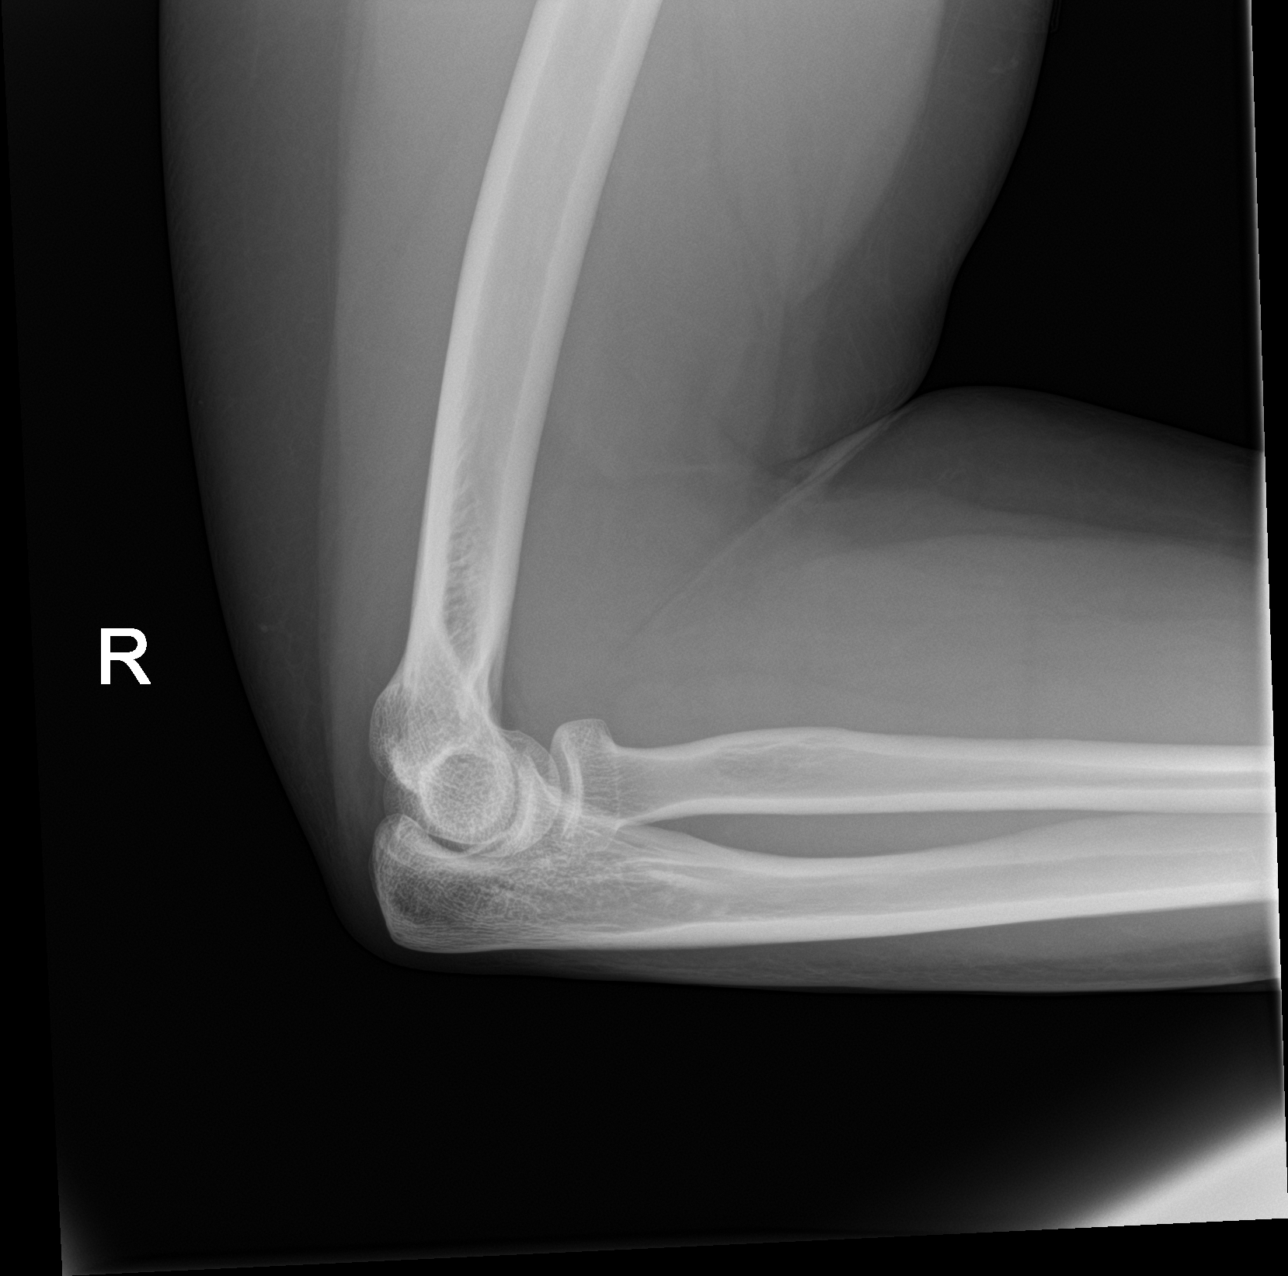

[4 of 4 positions shown; findings below may reference images not displayed]

FINDINGS: Frontal, bilateral oblique, lateral views of the right elbow
demonstrate no acute displaced fracture. Alignment is anatomic. No
joint effusion.
IMPRESSION: 1. Unremarkable right elbow.
